# Patient Record
Sex: Male | Born: 1955 | Race: White | Hispanic: No | Marital: Married | Smoking: Never smoker
Health system: Southern US, Community
[De-identification: ages and names within clinical notes are randomized; demographics above are authoritative.]

## PROBLEM LIST (undated history)

## (undated) DIAGNOSIS — I739 Peripheral vascular disease, unspecified: Secondary | ICD-10-CM

## (undated) DIAGNOSIS — U071 COVID-19: Secondary | ICD-10-CM

## (undated) DIAGNOSIS — I471 Supraventricular tachycardia, unspecified: Secondary | ICD-10-CM

## (undated) DIAGNOSIS — I5022 Chronic systolic (congestive) heart failure: Secondary | ICD-10-CM

## (undated) DIAGNOSIS — I429 Cardiomyopathy, unspecified: Secondary | ICD-10-CM

## (undated) HISTORY — PX: TONSILLECTOMY: SUR1361

## (undated) HISTORY — DX: Peripheral vascular disease, unspecified: I73.9

## (undated) HISTORY — DX: Supraventricular tachycardia, unspecified: I47.10

## (undated) HISTORY — PX: WISDOM TOOTH EXTRACTION: SHX21

## (undated) HISTORY — DX: Cardiomyopathy, unspecified: I42.9

## (undated) HISTORY — DX: COVID-19: U07.1

## (undated) HISTORY — DX: Chronic systolic (congestive) heart failure: I50.22

---
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
## 1977-05-31 HISTORY — PX: HERNIA REPAIR: SHX51

## 1998-12-14 ENCOUNTER — Emergency Department (HOSPITAL_COMMUNITY): Admission: EM | Admit: 1998-12-14 | Discharge: 1998-12-14 | Payer: Self-pay | Admitting: Emergency Medicine

## 1998-12-14 ENCOUNTER — Encounter: Payer: Self-pay | Admitting: Emergency Medicine

## 2005-08-11 ENCOUNTER — Ambulatory Visit (HOSPITAL_COMMUNITY): Admission: RE | Admit: 2005-08-11 | Discharge: 2005-08-11 | Payer: Self-pay | Admitting: Family Medicine

## 2005-08-11 ENCOUNTER — Emergency Department (HOSPITAL_COMMUNITY): Admission: EM | Admit: 2005-08-11 | Discharge: 2005-08-11 | Payer: Self-pay | Admitting: Family Medicine

## 2006-05-31 HISTORY — PX: SHOULDER SURGERY: SHX246

## 2006-06-08 ENCOUNTER — Ambulatory Visit: Payer: Self-pay | Admitting: Family Medicine

## 2006-06-08 LAB — CONVERTED CEMR LAB
ALT: 40 units/L (ref 0–40)
CO2: 30 meq/L (ref 19–32)
Calcium: 9.2 mg/dL (ref 8.4–10.5)
Chloride: 112 meq/L (ref 96–112)
Chol/HDL Ratio, serum: 5.1
Cholesterol: 189 mg/dL (ref 0–200)
Creatinine, Ser: 1 mg/dL (ref 0.4–1.5)
Glucose, Bld: 83 mg/dL (ref 70–99)
LDL DIRECT: 102 mg/dL
PSA: 0.9 ng/mL (ref 0.10–4.00)
Potassium: 4.3 meq/L (ref 3.5–5.1)
Sodium: 145 meq/L (ref 135–145)
Triglyceride fasting, serum: 258 mg/dL (ref 0–149)
VLDL: 52 mg/dL — ABNORMAL HIGH (ref 0–40)

## 2006-06-09 ENCOUNTER — Encounter: Payer: Self-pay | Admitting: Family Medicine

## 2006-06-09 LAB — CONVERTED CEMR LAB: Glucose, Bld: 82 mg/dL (ref 70–99)

## 2006-07-18 ENCOUNTER — Ambulatory Visit: Payer: Self-pay | Admitting: Family Medicine

## 2007-02-09 ENCOUNTER — Ambulatory Visit: Payer: Self-pay | Admitting: Family Medicine

## 2008-10-08 ENCOUNTER — Emergency Department (HOSPITAL_COMMUNITY): Admission: EM | Admit: 2008-10-08 | Discharge: 2008-10-09 | Payer: Self-pay | Admitting: Emergency Medicine

## 2008-10-21 ENCOUNTER — Encounter: Admission: RE | Admit: 2008-10-21 | Discharge: 2008-10-21 | Payer: Self-pay | Admitting: Neurological Surgery

## 2008-12-09 ENCOUNTER — Encounter: Admission: RE | Admit: 2008-12-09 | Discharge: 2008-12-09 | Payer: Self-pay | Admitting: Neurological Surgery

## 2009-01-13 ENCOUNTER — Encounter: Admission: RE | Admit: 2009-01-13 | Discharge: 2009-01-13 | Payer: Self-pay | Admitting: Neurological Surgery

## 2009-05-31 HISTORY — PX: COLONOSCOPY: SHX174

## 2009-05-31 HISTORY — PX: POLYPECTOMY: SHX149

## 2009-09-10 ENCOUNTER — Ambulatory Visit: Payer: Self-pay | Admitting: Family Medicine

## 2009-09-10 ENCOUNTER — Encounter (INDEPENDENT_AMBULATORY_CARE_PROVIDER_SITE_OTHER): Payer: Self-pay | Admitting: *Deleted

## 2009-09-10 DIAGNOSIS — R635 Abnormal weight gain: Secondary | ICD-10-CM | POA: Insufficient documentation

## 2009-09-10 DIAGNOSIS — H5310 Unspecified subjective visual disturbances: Secondary | ICD-10-CM | POA: Insufficient documentation

## 2009-09-10 DIAGNOSIS — R0602 Shortness of breath: Secondary | ICD-10-CM | POA: Insufficient documentation

## 2009-09-17 ENCOUNTER — Telehealth: Payer: Self-pay | Admitting: Family Medicine

## 2009-09-18 LAB — CONVERTED CEMR LAB
ALT: 55 units/L — ABNORMAL HIGH (ref 0–53)
AST: 36 units/L (ref 0–37)
Alkaline Phosphatase: 60 units/L (ref 39–117)
Basophils Absolute: 0 10*3/uL (ref 0.0–0.1)
Bilirubin, Direct: 0.1 mg/dL (ref 0.0–0.3)
Chloride: 107 meq/L (ref 96–112)
Cholesterol: 201 mg/dL — ABNORMAL HIGH (ref 0–200)
Eosinophils Relative: 1.1 % (ref 0.0–5.0)
Folate: 11.4 ng/mL
GFR calc non Af Amer: 93.69 mL/min (ref >60)
HCT: 40.3 % (ref 39.0–52.0)
Hemoglobin: 14.3 g/dL (ref 13.0–17.0)
Lymphocytes Relative: 36.6 % (ref 12.0–46.0)
Lymphs Abs: 2.4 10*3/uL (ref 0.7–4.0)
Monocytes Relative: 5.2 % (ref 3.0–12.0)
Neutro Abs: 3.7 10*3/uL (ref 1.4–7.7)
PSA: 0.5 ng/mL (ref 0.10–4.00)
Platelets: 253 10*3/uL (ref 150.0–400.0)
Potassium: 4.6 meq/L (ref 3.5–5.1)
Prolactin: 3.7 ng/mL
RDW: 13.2 % (ref 11.5–14.6)
Sodium: 144 meq/L (ref 135–145)
TSH: 1.44 microintl units/mL (ref 0.35–5.50)
Total Bilirubin: 0.7 mg/dL (ref 0.3–1.2)
Total CHOL/HDL Ratio: 5
VLDL: 28.6 mg/dL (ref 0.0–40.0)
WBC: 6.5 10*3/uL (ref 4.5–10.5)

## 2009-09-22 ENCOUNTER — Encounter: Payer: Self-pay | Admitting: Family Medicine

## 2009-10-20 ENCOUNTER — Ambulatory Visit: Payer: Self-pay | Admitting: Family Medicine

## 2009-10-20 DIAGNOSIS — R5383 Other fatigue: Secondary | ICD-10-CM | POA: Insufficient documentation

## 2009-10-20 DIAGNOSIS — R5381 Other malaise: Secondary | ICD-10-CM | POA: Insufficient documentation

## 2009-10-20 LAB — CONVERTED CEMR LAB
FSH: 6.5 milliintl units/mL (ref 1.4–18.1)
LH: 3.43 milliintl units/mL (ref 1.50–9.30)

## 2009-10-22 LAB — CONVERTED CEMR LAB
Sex Hormone Binding: 27 nmol/L (ref 13–71)
Testosterone Free: 48.6 pg/mL (ref 47.0–244.0)
Testosterone-% Free: 2.1 % (ref 1.6–2.9)

## 2009-10-29 ENCOUNTER — Emergency Department (HOSPITAL_COMMUNITY): Admission: EM | Admit: 2009-10-29 | Discharge: 2009-10-29 | Payer: Self-pay | Admitting: Emergency Medicine

## 2009-10-31 ENCOUNTER — Encounter (INDEPENDENT_AMBULATORY_CARE_PROVIDER_SITE_OTHER): Payer: Self-pay | Admitting: *Deleted

## 2009-11-04 ENCOUNTER — Ambulatory Visit: Payer: Self-pay | Admitting: Gastroenterology

## 2009-12-26 ENCOUNTER — Ambulatory Visit: Payer: Self-pay | Admitting: Gastroenterology

## 2010-01-01 ENCOUNTER — Encounter: Payer: Self-pay | Admitting: Gastroenterology

## 2010-06-30 NOTE — Letter (Signed)
Summary: Patient Notice- Polyp Results  [facility]  [address] [place], [place] [address]   Phone: [phone]  Fax: [phone]        January 01, 2010 MRN: [id number]    Lucas Willis [address] [place], [place]  [address]    Dear [name],  I am pleased to inform you that the colon polyp(s) removed during your recent colonoscopy was (were) found to be benign (no cancer detected) upon pathologic examination.  I recommend you have a repeat colonoscopy examination in 5 years to look for recurrent polyps, as having colon polyps increases your risk for having recurrent polyps or even colon cancer in the future.  Should you develop new or worsening symptoms of abdominal pain, bowel habit changes or bleeding from the rectum or bowels, please schedule an evaluation with either your primary care physician or with me.  Continue treatment plan as outlined the day of your exam.  Please call us if you are having persistent problems or have questions about your condition that have not been fully answered at this time.  Sincerely,  Meryl Dare MD [facility]  This letter has been electronically signed by your physician.  Appended Document: Patient Notice- Polyp Results letter mailed

## 2010-06-30 NOTE — Miscellaneous (Signed)
Summary: [facility]  Clinical Lists Changes  Medications: Added new medication of MOVIPREP 100 GM  SOLR (PEG-KCL-NACL-NASULF-NA ASC-C) As per prep instructions. - Signed Rx of MOVIPREP 100 GM  SOLR (PEG-KCL-NACL-NASULF-NA ASC-C) As per prep instructions.;  #1 x 0;  Signed;  Entered by: Ezra Sites RN;  Authorized by: Meryl Dare MD [facility];  Method used: [facility]  [address], [place], [place]  [address], Ph: [phone] or [phone], Fax: [phone] Observations: Added new observation of ALLERGY REV: Done (11/04/2009 8:01)    Prescriptions: MOVIPREP 100 GM  SOLR (PEG-KCL-NACL-NASULF-NA ASC-C) As per prep instructions.  #1 x 0   Entered by:   Ezra Sites RN   Authorized by:   Meryl Dare MD [facility]   Signed by:   Ezra Sites RN on 11/04/2009   Method used:   [facility]        [facility]  [address] (retail)       [address]       [place], [place]  [address]       Ph: [phone] or [phone]       Fax: [phone]   RxID:   [id number]

## 2010-06-30 NOTE — Procedures (Signed)
Summary: Colonoscopy  Patient: Lucas Willis Note: All result statuses are Final unless otherwise noted.  Tests: (1) Colonoscopy (COL)   COL Colonoscopy           DONE     [facility]     [address]     [place], [place]  [address]           COLONOSCOPY PROCEDURE REPORT           PATIENT:  Doyal, Saric  MR#:  [id number]     BIRTHDATE:  11-Oct-1955, 53 yrs. old  GENDER:  male     ENDOSCOPIST:  Judie Petit T. Russella Dar, MD, [facility]     Referred by:  Excell Seltzer, M.D.     PROCEDURE DATE:  12/26/2009     PROCEDURE:  Colonoscopy with snare polypectomy     ASA CLASS:  Class II     INDICATIONS:  1) Routine Risk Screening     MEDICATIONS:   Fentanyl 50 mcg IV, Versed 6 mg IV     DESCRIPTION OF PROCEDURE:   After the risks benefits and     alternatives of the procedure were thoroughly explained, informed     consent was obtained.  Digital rectal exam was performed and     revealed no abnormalities.   The LB [id number]-[id number] endoscope     was introduced through the anus and advanced to the cecum, which     was identified by both the appendix and ileocecal valve, without     limitations.  The quality of the prep was good, using MoviPrep.     The instrument was then slowly withdrawn as the colon was fully     examined.     <<PROCEDUREIMAGES>>     FINDINGS:  A sessile polyp was found in the proximal transverse     colon. It was 5 mm in size. Polyp was snared without cautery.     Retrieval was successful. Scattered diverticula were found     transverse colon to sigmoid colon.  This was otherwise a normal     examination of the colon. Retroflexed views in the rectum revealed     no abnormalities.  The time to cecum =  2  minutes. The scope was     then withdrawn (time =  8.75  min) from the patient and the     procedure completed.           COMPLICATIONS:  None           ENDOSCOPIC IMPRESSION:     1) 5 mm sessile polyp in the proximal transverse colon     2) Diverticula,  scattered           RECOMMENDATIONS:     1) Await pathology results     2) High fiber diet with liberal fluid intake.     3) If the polyp removed today is adenomatous (pre-cancerous),     you will need a repeat colonoscopy in 5 years. Otherwise you     should continue to follow colorectal cancer screening guidelines     for "routine risk" patients with colonoscopy in 10 years.     Venita Lick. Russella Dar, MD, [facility]           n.     eSIGNED:   Venita Lick. Quentin Strebel at 12/26/2009 01:56 PM           Bynum Bellows, [id number]  Note: An exclamation mark (!) indicates a result  that was not dispersed into the flowsheet. Document Creation Date: 12/26/2009 1:56 PM _______________________________________________________________________  (1) Order result status: Final Collection or observation date-time: 12/26/2009 13:51 Requested date-time:  Receipt date-time:  Reported date-time:  Referring Physician:   Ordering Physician: Claudette Head [phone]) Specimen Source:  Source: Launa Grill Order Number: [id number] Lab site:   Appended Document: Colonoscopy     Procedures Next Due Date:    Colonoscopy: 11/2014

## 2010-06-30 NOTE — Progress Notes (Signed)
Summary: pt requesting lab results  Phone Note Call from Patient Call back at [phone]   Caller: Patient Call For: Kerby Nora MD Summary of Call: Pt is requesting lab results.  Please review and advise. Initial call taken by: Lowella Petties CMA,  September 17, 2009 3:30 PM

## 2010-06-30 NOTE — Letter (Signed)
Summary: Previsit letter  [facility]  [address] [place], [place] [address]   Phone: [phone]  Fax: [phone]       09/10/2009 MRN: [id number]  Lucas Willis [address] [place], [place]  [address]  Dear Mr. [facility],  Welcome to the Gastroenterology Division at [facility].    You are scheduled to see a nurse for your pre-procedure visit on October 31, 2009 at 8:00am on the 3rd floor at [facility], [address]  We ask that you try to arrive at our office 15 minutes prior to your appointment time to allow for check-in.  Your nurse visit will consist of discussing your medical and surgical history, your immediate family medical history, and your medications.    Please bring a complete list of all your medications or, if you prefer, bring the medication bottles and we will list them.  We will need to be aware of both prescribed and over the counter drugs.  We will need to know exact dosage information as well.  If you are on blood thinners (Coumadin, Plavix, Aggrenox, Ticlid, etc.) please call our office today/prior to your appointment, as we need to consult with your physician about holding your medication.   Please be prepared to read and sign documents such as consent forms, a financial agreement, and acknowledgement forms.  If necessary, and with your consent, a friend or relative is welcome to sit-in on the nurse visit with you.  Please bring your insurance card so that we may make a copy of it.  If your insurance requires a referral to see a specialist, please bring your referral form from your primary care physician.  No co-pay is required for this nurse visit.     If you cannot keep your appointment, please call [phone] to cancel or reschedule prior to your appointment date.  This allows [place] the opportunity to schedule an appointment for another patient in need of care.    Thank you for choosing  Gastroenterology for your medical  needs.  We appreciate the opportunity to care for you.  Please visit [place] at our website  to learn more about our practice.                     Sincerely.                                                                                                                   The Gastroenterology Division
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
## 2010-06-30 NOTE — Letter (Signed)
Summary: [facility] Instructions  [facility]  [address] [place], [place] [address]   Phone: [phone]  Fax: [phone]       Lucas Willis    04/30/1969    MRN: [id number]        Procedure Day /Date: Friday 12/26/09     Arrival Time: 12:30 pm     Procedure Time: 1:30 pm     Location of Procedure:                    _x_   Endoscopy Center (4th Floor)   PREPARATION FOR COLONOSCOPY WITH MOVIPREP   Starting 5 days prior to your procedure  12/21/09  do not eat nuts, seeds, popcorn, corn, beans, peas,  salads, or any raw vegetables.  Do not take any fiber supplements (e.g. Metamucil, Citrucel, and Benefiber).  THE DAY BEFORE YOUR PROCEDURE         DATE:  12/25/09  DAY:  Thursday  1.  Drink clear liquids the entire day-NO SOLID FOOD  2.  Do not drink anything colored red or purple.  Avoid juices with pulp.  No orange juice.  3.  Drink at least 64 oz. (8 glasses) of fluid/clear liquids during the day to prevent dehydration and help the prep work efficiently.  CLEAR LIQUIDS INCLUDE: Water Jello Ice Popsicles Tea (sugar ok, no milk/cream) Powdered fruit flavored drinks Coffee (sugar ok, no milk/cream) Gatorade Juice: apple, white grape, white cranberry  Lemonade Clear bullion, consomm, broth Carbonated beverages (any kind) Strained chicken noodle soup Hard Candy                             4.  In the morning, mix first dose of MoviPrep solution:    Empty 1 Pouch A and 1 Pouch B into the disposable container    Add lukewarm drinking water to the top line of the container. Mix to dissolve    Refrigerate (mixed solution should be used within 24 hrs)  5.  Begin drinking the prep at 5:00 p.m. The MoviPrep container is divided by 4 marks.   Every 15 minutes drink the solution down to the next mark (approximately 8 oz) until the full liter is complete.   6.  Follow completed prep with 16 oz of clear liquid of your choice (Nothing red or purple).   Continue to drink clear liquids until bedtime.  7.  Before going to bed, mix second dose of MoviPrep solution:    Empty 1 Pouch A and 1 Pouch B into the disposable container    Add lukewarm drinking water to the top line of the container. Mix to dissolve    Refrigerate  THE DAY OF YOUR PROCEDURE      DATE:  12/26/09  DAY:  Friday  Beginning at  8:30 a.m. (5 hours before procedure):         1. Every 15 minutes, drink the solution down to the next mark (approx 8 oz) until the full liter is complete.  2. Follow completed prep with 16 oz. of clear liquid of your choice.    3. You may drink clear liquids until  11:30 a.m.  (2 HOURS BEFORE PROCEDURE).   MEDICATION INSTRUCTIONS  Unless otherwise instructed, you should take regular prescription medications with a small sip of water   as early as possible the morning of your procedure.        OTHER INSTRUCTIONS  You will need a responsible adult at least 55 years of age to accompany you and drive you home.   This person must remain in the waiting room during your procedure.  Wear loose fitting clothing that is easily removed.  Leave jewelry and other valuables at home.  However, you may wish to bring a book to read or  an iPod/MP3 player to listen to music as you wait for your procedure to start.  Remove all body piercing jewelry and leave at home.  Total time from sign-in until discharge is approximately 2-3 hours.  You should go home directly after your procedure and rest.  You can resume normal activities the  day after your procedure.  The day of your procedure you should not:   Drive   Make legal decisions   Operate machinery   Drink alcohol   Return to work  You will receive specific instructions about eating, activities and medications before you leave.    The above instructions have been reviewed and explained to me by   Ezra Sites RN  November 04, 2009 8:24 AM     I fully understand and can verbalize  these instructions _____________________________ Date _________
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
## 2013-08-20 ENCOUNTER — Emergency Department (HOSPITAL_COMMUNITY)
Admission: EM | Admit: 2013-08-20 | Discharge: 2013-08-20 | Disposition: A | Discharge status: Home / Self Care | Payer: 59 | Source: Home / Self Care | Attending: Emergency Medicine | Admitting: Emergency Medicine

## 2013-08-20 ENCOUNTER — Encounter (HOSPITAL_COMMUNITY): Payer: Self-pay | Admitting: Emergency Medicine

## 2013-08-20 ENCOUNTER — Emergency Department (INDEPENDENT_AMBULATORY_CARE_PROVIDER_SITE_OTHER): Payer: 59

## 2013-08-20 DIAGNOSIS — W19XXXA Unspecified fall, initial encounter: Secondary | ICD-10-CM

## 2013-08-20 DIAGNOSIS — S7000XA Contusion of unspecified hip, initial encounter: Secondary | ICD-10-CM

## 2013-08-20 DIAGNOSIS — S7001XA Contusion of right hip, initial encounter: Secondary | ICD-10-CM

## 2013-08-20 LAB — POCT URINALYSIS DIP (DEVICE)
BILIRUBIN URINE: NEGATIVE
GLUCOSE, UA: NEGATIVE mg/dL
HGB URINE DIPSTICK: NEGATIVE
Leukocytes, UA: NEGATIVE
Nitrite: NEGATIVE
Protein, ur: NEGATIVE mg/dL
SPECIFIC GRAVITY, URINE: 1.025 (ref 1.005–1.030)
Urobilinogen, UA: 0.2 mg/dL (ref 0.0–1.0)
pH: 5.5 (ref 5.0–8.0)

## 2013-08-20 MED ORDER — DICLOFENAC SODIUM 75 MG PO TBEC: 75.0000 mg | DELAYED_RELEASE_TABLET | Freq: Two times a day (BID) | ORAL | Status: DC

## 2013-08-20 MED ORDER — OXYCODONE-ACETAMINOPHEN 5-325 MG PO TABS: ORAL_TABLET | ORAL | Status: DC
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
## 2013-08-20 NOTE — Discharge Instructions (Signed)
RICE: Routine Care for Injuries The routine care of many injuries includes Rest, Ice, Compression, and Elevation (RICE). HOME CARE INSTRUCTIONS  Rest is needed to allow your body to heal. Routine activities can usually be resumed when comfortable. Injured tendons and bones can take up to 6 weeks to heal. Tendons are the cord-like structures that attach muscle to bone.  Ice following an injury helps keep the swelling down and reduces pain.  Put ice in a plastic bag.  Place a towel between your skin and the bag.  Leave the ice on for 15-20 minutes, 03-04 times a day. Do this while awake, for the first 24 to 48 hours. After that, continue as directed by your caregiver.  Compression helps keep swelling down. It also gives support and helps with discomfort. If an elastic bandage has been applied, it should be removed and reapplied every 3 to 4 hours. It should not be applied tightly, but firmly enough to keep swelling down. Watch fingers or toes for swelling, bluish discoloration, coldness, numbness, or excessive pain. If any of these problems occur, remove the bandage and reapply loosely. Contact your caregiver if these problems continue.  Elevation helps reduce swelling and decreases pain. With extremities, such as the arms, hands, legs, and feet, the injured area should be placed near or above the level of the heart, if possible. SEEK IMMEDIATE MEDICAL CARE IF:  You have persistent pain and swelling.  You develop redness, numbness, or unexpected weakness.  Your symptoms are getting worse rather than improving after several days. These symptoms may indicate that further evaluation or further X-rays are needed. Sometimes, X-rays may not show a small broken bone (fracture) until 1 week or 10 days later. Make a follow-up appointment with your caregiver. Ask when your X-ray results will be ready. Make sure you get your X-ray results. Document Released: 08/29/2000 Document Revised: 08/09/2011  Document Reviewed: 10/16/2010 [facility] Patient Information 2014 [place], [place].   Most hip pain is caused by osteoarthritis, bursitis, or tendonitis.  Simple measures plus regular gentle exercises can help.  Do not do the following:  Avoid squatting and doing deep knee bends.  This puts too much of load on your cartiledges and tendons.  If you do a knee bend, go only half way down, flexing your knee no more than 90 degrees.  Avoid sleeping on the side that hurts.  Do the following:  If you are overweight or obese, lose weight.  This makes for a lot less load on your hip joints.  If you use tobacco, quit.  Nicotine causes spasm of the small arteries, decreases blood flow, and impairs your body's normal ability to repair damage.  If your hip is acutely inflamed, use the principles of RICE (rest, ice, compression, and elevation).  Use of over the counter pain meds can be of help.  Tylenol (or acetaminophen) is the safest to use.  It often helps to take this regularly.  You can take up to 2 325 mg tablets 5 times daily, but it best to start out much lower that that, perhaps 2 325 mg tablets twice daily, then increase from there. People who are on the blood thinner warfarin have to be careful about taking high doses of Tylenol.  For people who are able to tolerate them, ibuprofen and naproxyn can also help with the pain.  You should discuss these agents with your physician before taking them.  People with chronic kidney disease, hypertension, peptic ulcer disease, and reflux can suffer  adverse side effects. They should not be taken with warfarin. The maximum dosage of ibuprofen is 800 mg 3 times daily with meals.  The maximum dosage of naprosyn is 2 and 1/2 tablets twice daily with food, but again, start out low and gradually increase the dose until adequate pain relief is achieved. Ibuprofen and naprosyn should always be taken with food.  People with cartiledge injury or osteoarthritis may find  glucosamine to be helpful.  This is an over-the-counter supplement that helps nourish and repair cartiledge.  The dose is 500 mg 3 times daily or 1500 mg taken in a single dose. This can take several months to work and it doesn't always work.    For people with hip pain on just one side, use of a cane held in the hand on the same side as the hip pain takes some of the stress off the hip joint and can make a big difference in hip pain.  Wearing good shoes with adequate arch support is essential. Use of an orthotic insert can be very helpful.  These can be purchased at a shoe store or inexpensive inserts can be gotten at the drug store.  Regular exercise is of utmost importance.  Swimming, water aerobics, low impact aerobics, yoga or tai chi are helpful.  Use of an elliptical exerciser put the least stress on the hips of any type of exercise machine.  Finally doing the exercises below can be very helpful. Try to do them twice a day followed by ice for 10 minutes.

## 2013-09-04 ENCOUNTER — Telehealth (HOSPITAL_COMMUNITY): Payer: Self-pay | Admitting: *Deleted

## 2013-09-04 NOTE — ED Notes (Signed)
Pt. called and told registration clerk that he needed a refill of his anti-inflammatory drug.  I called pt. back and left a message that we are not a primary care facility and we do not do refills of medications.  I told him he would have to come back and be rechecked and to call me back if any questions. Roselyn Meier 09/04/2013

## 2014-05-14 ENCOUNTER — Ambulatory Visit (INDEPENDENT_AMBULATORY_CARE_PROVIDER_SITE_OTHER): Payer: 59 | Admitting: Family Medicine

## 2014-05-14 ENCOUNTER — Encounter (INDEPENDENT_AMBULATORY_CARE_PROVIDER_SITE_OTHER): Payer: Self-pay

## 2014-05-14 ENCOUNTER — Encounter: Payer: Self-pay | Admitting: Family Medicine

## 2014-05-14 VITALS — BP 115/82 | HR 89 | Temp 98.4°F | Ht 68.5 in | Wt 202.5 lb

## 2014-05-14 DIAGNOSIS — Z125 Encounter for screening for malignant neoplasm of prostate: Secondary | ICD-10-CM

## 2014-05-14 DIAGNOSIS — Z13220 Encounter for screening for lipoid disorders: Secondary | ICD-10-CM

## 2014-05-14 DIAGNOSIS — R61 Generalized hyperhidrosis: Secondary | ICD-10-CM

## 2014-05-14 DIAGNOSIS — R252 Cramp and spasm: Secondary | ICD-10-CM

## 2014-05-14 DIAGNOSIS — M25562 Pain in left knee: Secondary | ICD-10-CM

## 2014-05-14 HISTORY — DX: Cramp and spasm: R25.2

## 2014-05-14 LAB — LIPID PANEL
Cholesterol: 223 mg/dL — ABNORMAL HIGH (ref 0–200)
HDL: 37.9 mg/dL — AB (ref >39.00)
LDL CALC: 158 mg/dL — AB (ref 0–99)
NONHDL: 185.1
Total CHOL/HDL Ratio: 6
Triglycerides: 134 mg/dL (ref 0.0–149.0)
VLDL: 26.8 mg/dL (ref 0.0–40.0)

## 2014-05-14 LAB — COMPREHENSIVE METABOLIC PANEL
ALK PHOS: 66 U/L (ref 39–117)
ALT: 31 U/L (ref 0–53)
AST: 29 U/L (ref 0–37)
Albumin: 3.9 g/dL (ref 3.5–5.2)
BILIRUBIN TOTAL: 0.8 mg/dL (ref 0.2–1.2)
BUN: 20 mg/dL (ref 6–23)
CO2: 25 mEq/L (ref 19–32)
CREATININE: 0.9 mg/dL (ref 0.4–1.5)
Calcium: 9.4 mg/dL (ref 8.4–10.5)
Chloride: 109 mEq/L (ref 96–112)
GFR: 97.07 mL/min (ref >60.00)
GLUCOSE: 91 mg/dL (ref 70–99)
Potassium: 4.7 mEq/L (ref 3.5–5.1)
SODIUM: 138 meq/L (ref 135–145)
TOTAL PROTEIN: 6.7 g/dL (ref 6.0–8.3)

## 2014-05-14 LAB — CBC WITH DIFFERENTIAL/PLATELET
BASOS PCT: 0.6 % (ref 0.0–3.0)
Basophils Absolute: 0 10*3/uL (ref 0.0–0.1)
EOS ABS: 0.1 10*3/uL (ref 0.0–0.7)
EOS PCT: 1.7 % (ref 0.0–5.0)
HEMATOCRIT: 45 % (ref 39.0–52.0)
HEMOGLOBIN: 15.1 g/dL (ref 13.0–17.0)
Lymphocytes Relative: 34.1 % (ref 12.0–46.0)
Lymphs Abs: 2.4 10*3/uL (ref 0.7–4.0)
MCHC: 33.7 g/dL (ref 30.0–36.0)
MCV: 96.4 fl (ref 78.0–100.0)
MONO ABS: 0.4 10*3/uL (ref 0.1–1.0)
Monocytes Relative: 5.7 % (ref 3.0–12.0)
NEUTROS ABS: 4.1 10*3/uL (ref 1.4–7.7)
Neutrophils Relative %: 57.9 % (ref 43.0–77.0)
Platelets: 275 10*3/uL (ref 150.0–400.0)
RBC: 4.67 Mil/uL (ref 4.22–5.81)
RDW: 13.4 % (ref 11.5–15.5)
WBC: 7.1 10*3/uL (ref 4.0–10.5)

## 2014-05-14 LAB — TSH: TSH: 1.74 u[IU]/mL (ref 0.35–4.50)

## 2014-05-14 LAB — PSA: PSA: 0.83 ng/mL (ref 0.10–4.00)

## 2014-05-14 NOTE — Patient Instructions (Addendum)
Stop at lab on way out. Cancel lab only visit, keep appt for CPX as scheduled. Increase water daily, goal 64 oz of water a day.
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
## 2014-05-14 NOTE — Progress Notes (Signed)
Pre visit review using our clinic review tool, if applicable. No additional management support is needed unless otherwise documented below in the visit note. 

## 2014-05-14 NOTE — Assessment & Plan Note (Signed)
Increase hydration. Eval with labs. Gentle stretching.

## 2014-05-14 NOTE — Assessment & Plan Note (Signed)
Per Dr. Theda Sers.

## 2014-05-14 NOTE — Assessment & Plan Note (Signed)
Will eval with labs. No there red flags.

## 2014-05-15 ENCOUNTER — Encounter: Payer: Self-pay | Admitting: *Deleted

## 2014-05-21 ENCOUNTER — Other Ambulatory Visit: Payer: 59

## 2014-06-04 ENCOUNTER — Ambulatory Visit (INDEPENDENT_AMBULATORY_CARE_PROVIDER_SITE_OTHER): Payer: BLUE CROSS/BLUE SHIELD | Admitting: Family Medicine

## 2014-06-04 ENCOUNTER — Encounter: Payer: Self-pay | Admitting: Family Medicine

## 2014-06-04 VITALS — BP 102/80 | HR 87 | Temp 98.4°F | Ht 68.5 in | Wt 202.5 lb

## 2014-06-04 DIAGNOSIS — E7800 Pure hypercholesterolemia, unspecified: Secondary | ICD-10-CM

## 2014-06-04 HISTORY — DX: Pure hypercholesterolemia, unspecified: E78.00

## 2014-06-04 NOTE — Progress Notes (Signed)
Pre visit review using our clinic review tool, if applicable. No additional management support is needed unless otherwise documented below in the visit note. 

## 2014-06-04 NOTE — Progress Notes (Signed)
Subjective:    Patient ID: Lucas Willis, male    DOB: 02/05/1956, 59 y.o.   MRN: [id number]  HPI 59 year old male presents for wellness visit.   Hypercholesterol: Not at goal LDL < 130 on no medication. Lab Results  Component Value Date   CHOL 223* 05/14/2014   HDL 37.90* 05/14/2014   LDLCALC 158* 05/14/2014   LDLDIRECT 142.0 09/10/2009   TRIG 134.0 05/14/2014   CHOLHDL 6 05/14/2014  Diet: poor, eats fast food daily Exercise:off and on. Plays soccer.  Wt Readings from Last 3 Encounters:  06/04/14 202 lb 8 oz (91.853 kg)  05/14/14 202 lb 8 oz (91.853 kg)  09/10/09 208 lb (94.348 kg)     Review of Systems  Constitutional: Negative for fever, fatigue and unexpected weight change.  HENT: Negative for congestion, ear pain, postnasal drip, rhinorrhea, sore throat and trouble swallowing.   Eyes: Negative for pain.  Respiratory: Negative for cough, shortness of breath and wheezing.   Cardiovascular: Negative for chest pain, palpitations and leg swelling.  Gastrointestinal: Negative for nausea, abdominal pain, diarrhea, constipation and blood in stool.  Genitourinary: Negative for dysuria, urgency, hematuria, discharge, penile swelling, scrotal swelling, difficulty urinating, penile pain and testicular pain.  Musculoskeletal:       STILL OCC ANKLE LOCKS IN PLACE, NML LABS, OCC SWELLING IN LEFT ANKLE  Skin: Negative for rash.  Neurological: Negative for syncope, weakness, light-headedness, numbness and headaches.  Psychiatric/Behavioral: Negative for behavioral problems and dysphoric mood. The patient is not nervous/anxious.        Objective:   Physical Exam  Constitutional: He appears well-developed and well-nourished.  Non-toxic appearance. He does not appear ill. No distress.  HENT:  Head: Normocephalic and atraumatic.  Right Ear: Hearing, tympanic membrane, external ear and ear canal normal.  Left Ear: Hearing, tympanic membrane, external ear and ear canal normal.    Nose: Nose normal.  Mouth/Throat: Uvula is midline, oropharynx is clear and moist and mucous membranes are normal.  Eyes: Conjunctivae, EOM and lids are normal. Pupils are equal, round, and reactive to light. Lids are everted and swept, no foreign bodies found.  Neck: Trachea normal, normal range of motion and phonation normal. Neck supple. Carotid bruit is not present. No thyroid mass and no thyromegaly present.  Cardiovascular: Normal rate, regular rhythm, S1 normal, S2 normal, intact distal pulses and normal pulses.  Exam reveals no gallop.   No murmur heard. Pulmonary/Chest: Breath sounds normal. He has no wheezes. He has no rhonchi. He has no rales.  Abdominal: Soft. Normal appearance and bowel sounds are normal. There is no hepatosplenomegaly. There is no tenderness. There is no rebound, no guarding and no CVA tenderness. No hernia. Hernia confirmed negative in the right inguinal area and confirmed negative in the left inguinal area.  Genitourinary: Prostate normal, testes normal and penis normal. Rectal exam shows no external hemorrhoid, no internal hemorrhoid, no fissure, no mass, no tenderness and anal tone normal. Guaiac negative stool. Prostate is not enlarged and not tender. Right testis shows no mass and no tenderness. Left testis shows no mass and no tenderness. No paraphimosis or penile tenderness.  Lymphadenopathy:    He has no cervical adenopathy.       Right: No inguinal adenopathy present.       Left: No inguinal adenopathy present.  Neurological: He is alert. He has normal strength and normal reflexes. No cranial nerve deficit or sensory deficit. Gait normal.  Skin: Skin is warm, dry and  intact. No rash noted.  Psychiatric: He has a normal mood and affect. His speech is normal and behavior is normal. Judgment normal.          Assessment & Plan:  The patient's preventative maintenance and recommended screening tests for an annual wellness exam were reviewed in full  today. Brought up to date unless services declined.  Counselled on the importance of diet, exercise, and its role in overall health and mortality. The patient's FH and SH was reviewed, including their home life, tobacco status, and drug and alcohol status.   Vaccines:uptodate with Td, flu Colon: last 11/2009, 5 mm sessile polyp,  Repeat in 5 years per Dr.  Fuller Plan  STD: no concerns.  Nonsmoker  Prostate:  Lab Results  Component Value Date   PSA 0.83 05/14/2014   PSA 0.50 09/10/2009   PSA 0.90 06/08/2006

## 2014-06-04 NOTE — Patient Instructions (Addendum)
Work on low cholesterol diet and start regular exercise . Work on weight loss.  Do not skip meals. Decrease fast foods.  Schedule lab recheck for cholesterol in 3 months. Expect colonoscopy repeat after 11/2014.

## 2014-08-01 ENCOUNTER — Telehealth: Payer: Self-pay

## 2014-08-01 NOTE — Telephone Encounter (Signed)
Pt request itemized bill for 06/04/2014 office visit; pt has supplemental ins that he wants to file. Advised to call [facility] [phone]. Pt voiced understanding.

## 2014-10-17 ENCOUNTER — Encounter: Payer: Self-pay | Admitting: Gastroenterology

## 2014-11-05 ENCOUNTER — Encounter: Payer: Self-pay | Admitting: Gastroenterology

## 2015-01-19 ENCOUNTER — Encounter (HOSPITAL_COMMUNITY): Payer: Self-pay | Admitting: Emergency Medicine

## 2015-01-19 ENCOUNTER — Emergency Department (INDEPENDENT_AMBULATORY_CARE_PROVIDER_SITE_OTHER)
Admission: EM | Admit: 2015-01-19 | Discharge: 2015-01-19 | Disposition: A | Discharge status: Home / Self Care | Payer: BLUE CROSS/BLUE SHIELD | Source: Home / Self Care | Attending: Emergency Medicine | Admitting: Emergency Medicine

## 2015-01-19 DIAGNOSIS — S41112A Laceration without foreign body of left upper arm, initial encounter: Secondary | ICD-10-CM | POA: Diagnosis not present

## 2015-01-19 NOTE — Discharge Instructions (Signed)
Laceration Care, Adult A laceration is a cut or lesion that goes through all layers of the skin and into the tissue just beneath the skin. TREATMENT  Some lacerations may not require closure. Some lacerations may not be able to be closed due to an increased risk of infection. It is important to see your caregiver as soon as possible after an injury to minimize the risk of infection and maximize the opportunity for successful closure. If closure is appropriate, pain medicines may be given, if needed. The wound will be cleaned to help prevent infection. Your caregiver will use stitches (sutures), staples, wound glue (adhesive), or skin adhesive strips to repair the laceration. These tools bring the skin edges together to allow for faster healing and a better cosmetic outcome. However, all wounds will heal with a scar. Once the wound has healed, scarring can be minimized by covering the wound with sunscreen during the day for 1 full year. HOME CARE INSTRUCTIONS  For sutures or staples:  Keep the wound clean and dry.  If you were given a bandage (dressing), you should change it at least once a day. Also, change the dressing if it becomes wet or dirty, or as directed by your caregiver.  Wash the wound with soap and water 2 times a day. Rinse the wound off with water to remove all soap. Pat the wound dry with a clean towel.  After cleaning, apply a thin layer of the antibiotic ointment as recommended by your caregiver. This will help prevent infection and keep the dressing from sticking.  You may shower as usual after the first 24 hours. Do not soak the wound in water until the sutures are removed.  Only take over-the-counter or prescription medicines for pain, discomfort, or fever as directed by your caregiver.  Get your sutures or staples removed as directed by your caregiver. You may need a tetanus shot if:  You cannot remember when you had your last tetanus shot.  You have never had a tetanus  shot. If you get a tetanus shot, your arm may swell, get red, and feel warm to the touch. This is common and not a problem. If you need a tetanus shot and you choose not to have one, there is a rare chance of getting tetanus. Sickness from tetanus can be serious. SEEK MEDICAL CARE IF:   You have redness, swelling, or increasing pain in the wound.  You see a red line that goes away from the wound.  You have yellowish-white fluid (pus) coming from the wound.  You have a fever.  You notice a bad smell coming from the wound or dressing.  Your wound breaks open before or after sutures have been removed.  You notice something coming out of the wound such as wood or glass.  Your wound is on your hand or foot and you cannot move a finger or toe. SEEK IMMEDIATE MEDICAL CARE IF:   Your pain is not controlled with prescribed medicine.  You have severe swelling around the wound causing pain and numbness or a change in color in your arm, hand, leg, or foot.  Your wound splits open and starts bleeding.  You have worsening numbness, weakness, or loss of function of any joint around or beyond the wound.  You develop painful lumps near the wound or on the skin anywhere on your body. MAKE SURE YOU:   Understand these instructions.  Will watch your condition.  Will get help right away if you are not  doing well or get worse. Document Released: 05/17/2005 Document Revised: 08/09/2011 Document Reviewed: 11/10/2010 [facility] Patient Information 2015 [place], [place]. This information is not intended to replace advice given to you by your health care provider. Make sure you discuss any questions you have with your health care provider.
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
## 2015-05-23 ENCOUNTER — Ambulatory Visit (INDEPENDENT_AMBULATORY_CARE_PROVIDER_SITE_OTHER): Payer: BLUE CROSS/BLUE SHIELD | Admitting: Family Medicine

## 2015-05-23 ENCOUNTER — Encounter: Payer: Self-pay | Admitting: Family Medicine

## 2015-05-23 VITALS — BP 138/98 | HR 101 | Temp 97.7°F | Wt 206.0 lb

## 2015-05-23 DIAGNOSIS — R252 Cramp and spasm: Secondary | ICD-10-CM | POA: Diagnosis not present

## 2015-05-23 DIAGNOSIS — L309 Dermatitis, unspecified: Secondary | ICD-10-CM | POA: Diagnosis not present

## 2015-05-23 LAB — COMPREHENSIVE METABOLIC PANEL
ALK PHOS: 65 U/L (ref 39–117)
ALT: 30 U/L (ref 0–53)
AST: 36 U/L (ref 0–37)
Albumin: 3.9 g/dL (ref 3.5–5.2)
BILIRUBIN TOTAL: 0.4 mg/dL (ref 0.2–1.2)
BUN: 35 mg/dL — AB (ref 6–23)
CO2: 28 mEq/L (ref 19–32)
CREATININE: 1.01 mg/dL (ref 0.40–1.50)
Calcium: 9.2 mg/dL (ref 8.4–10.5)
Chloride: 108 mEq/L (ref 96–112)
GFR: 80.35 mL/min (ref >60.00)
GLUCOSE: 96 mg/dL (ref 70–99)
Potassium: 4.3 mEq/L (ref 3.5–5.1)
SODIUM: 142 meq/L (ref 135–145)
TOTAL PROTEIN: 6.4 g/dL (ref 6.0–8.3)

## 2015-05-23 LAB — MAGNESIUM: Magnesium: 1.6 mg/dL (ref 1.5–2.5)

## 2015-05-23 MED ORDER — TRIAMCINOLONE ACETONIDE 0.5 % EX CREA: 1.0000 "application " | TOPICAL_CREAM | Freq: Two times a day (BID) | CUTANEOUS | Status: DC

## 2015-05-23 NOTE — Progress Notes (Signed)
Pre visit review using our clinic review tool, if applicable. No additional management support is needed unless otherwise documented below in the visit note. 

## 2015-05-23 NOTE — Progress Notes (Signed)
   Subjective:    Patient ID: Lucas Willis, male    DOB: Nov 11, 1955, 59 y.o.   MRN: [id number]  HPI  59 year old male presents with new onset leg cramps bilateral in last several months, intermittent, worsening. Ongoing every other night. Bilateral, usually left sided cramps in ankles, calves,  One time very intense. Lasted 10 min one time. occ thigh cramps.  Mainly on left side.  occ cramp in fingers as well. Only happens in evening sitting in bed or in chair.  He denies restless feeling in legs. Muscles occ twitch. Numbness, no weakness. No fever.    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 TSH nml, cbc nml,  cmet nml 04/2014. No longer having night sweats.  Occ takes wifes potassium , maybe helps some in few days.  Takes ibuprofen and  Daily vit , as well as vit B12.  For years he has had itchy area on left dorsal hand. No rash.. Now with scratching thickned skin and broken hairs.  No improvement with hydrocortisone cream.   He is on no new meds. Social History /Family History/Past Medical History reviewed and updated if needed.   Review of Systems  Constitutional: Negative for fever and fatigue.  HENT: Negative for ear pain.   Eyes: Negative for pain.  Respiratory: Negative for cough.   Cardiovascular: Negative for chest pain and leg swelling.       Objective:   Physical Exam  Constitutional: Vital signs are normal. He appears well-developed and well-nourished.  HENT:  Head: Normocephalic.  Right Ear: Hearing normal.  Left Ear: Hearing normal.  Nose: Nose normal.  Mouth/Throat: Oropharynx is clear and moist and mucous membranes are normal.  Neck: Trachea normal. Carotid bruit is not present. No thyroid mass and no thyromegaly present.  Cardiovascular: Normal rate, regular rhythm and normal pulses.  Exam reveals no gallop, no distant heart sounds and no friction rub.   No murmur heard. No peripheral edema    Pulmonary/Chest: Effort normal and breath sounds normal. No respiratory distress.  Skin: Skin is warm, dry and intact. Rash noted. No erythema.  Dry thickened skin on left dorsal hand, broken hairs from rubbing area  Psychiatric: He has a normal mood and affect. His speech is normal and behavior is normal. Thought content normal.          Assessment & Plan:

## 2015-05-23 NOTE — Patient Instructions (Addendum)
Try yellow mustard or tonic water nightly.  Start nightly stretching. Stop at lab on way out. Apply steroid cream ot thickened skin on left hand twice daily x 2 weeks, can use for reoccurance as well.

## 2015-05-23 NOTE — Assessment & Plan Note (Signed)
Eval with labs. May be from lactic acid. No clear sign of OVD.  Treat with gentle stretchin and yellow mustard. If not improving consider requip for possible atypical RLS.

## 2015-05-23 NOTE — Assessment & Plan Note (Signed)
Likely from chronic scratching. Treat with topical steroid cream BID x 2 weeks.

## 2015-08-11 ENCOUNTER — Encounter (HOSPITAL_COMMUNITY): Payer: Self-pay | Admitting: Emergency Medicine

## 2015-08-11 ENCOUNTER — Emergency Department (INDEPENDENT_AMBULATORY_CARE_PROVIDER_SITE_OTHER): Payer: BLUE CROSS/BLUE SHIELD

## 2015-08-11 ENCOUNTER — Emergency Department (INDEPENDENT_AMBULATORY_CARE_PROVIDER_SITE_OTHER)
Admission: EM | Admit: 2015-08-11 | Discharge: 2015-08-11 | Disposition: A | Discharge status: Home / Self Care | Payer: BLUE CROSS/BLUE SHIELD | Source: Home / Self Care | Attending: Emergency Medicine | Admitting: Emergency Medicine

## 2015-08-11 DIAGNOSIS — R0609 Other forms of dyspnea: Secondary | ICD-10-CM

## 2015-08-11 DIAGNOSIS — R9431 Abnormal electrocardiogram [ECG] [EKG]: Secondary | ICD-10-CM

## 2015-08-11 DIAGNOSIS — R0600 Dyspnea, unspecified: Secondary | ICD-10-CM

## 2015-08-11 NOTE — Discharge Instructions (Signed)
Your EKG has some abnormalities that need to be evaluated further. Since you look so well, I spoke with Dr. Terrence Dupont, a cardiologist. Please call his office first thing tomorrow to set up an appointment for additional evaluation. If you develop chest pain, nausea, dizziness, sweatiness, please go directly to the emergency room.

## 2015-08-11 NOTE — ED Notes (Signed)
The patient presented to the [facility] with a complaint of shortness of breath that has been ongoing greater than a month.

## 2015-08-11 NOTE — ED Provider Notes (Signed)
CSN: [id number]     Arrival date & time 08/11/15  1820 History   First MD Initiated Contact with Patient 08/11/15 1950     Chief Complaint  Patient presents with  . Shortness of Breath   (Consider location/radiation/quality/duration/timing/severity/associated sxs/prior Treatment) HPI  He is a 60 year old man here for evaluation of shortness of breath. He states this started about a month ago. He states it only occurs with exertion. He will fill a slight discomfort in the xiphoid area when this occurs. This resolves quickly with rest. No palpitations, diaphoresis, or dizziness. He also reports worsening swelling in his legs over the last month.  He states he can run for about a minute before having to stop to catch his breath.  He states his wife told him his color is off.  He also reports getting a head rush very quickly when he bends forward.  He has no medical history. His mother does have a history of heart failure, but no MI. He has previously been a very active man, regularly running, playing soccer, and refereeing soccer. He was diagnosed and treated for bronchial pneumonia in January, then had a ruptured tendon in his left leg.  He noticed his symptoms when he tried to go for run when the boot was removed.  History reviewed. No pertinent past medical history. Past Surgical History  Procedure Laterality Date  . Cyst removal leg Left 1970    back of L knee  . Plantar's wart excision  1972  . Hernia repair Left 1979    inguinal  . Shoulder surgery Right 2008    Dislocation Repair   Family History  Problem Relation Age of Onset  . Heart failure Mother    Social History  Substance Use Topics  . Smoking status: Never Smoker   . Smokeless tobacco: Never Used  . Alcohol Use: 0.0 oz/week    0 Standard drinks or equivalent per week     Comment: rare    Review of Systems As in history of present illness Allergies  Codeine and Sulfonamide derivatives  Home Medications   Prior  to Admission medications   Medication Sig Start Date End Date Taking? Authorizing Provider  triamcinolone cream (KENALOG) 0.5 % Apply 1 application topically 2 (two) times daily. 05/23/15   Amy Cletis Athens, MD   Meds Ordered and Administered this Visit  Medications - No data to display  BP 143/104 mmHg  Pulse 90  Temp(Src) 98.3 F (36.8 C) (Oral)  Resp 16  SpO2 98% No data found.   Physical Exam  Constitutional: He is oriented to person, place, and time. He appears well-developed and well-nourished. No distress.  Neck: Neck supple.  Cardiovascular: Normal rate, regular rhythm and normal heart sounds.  Exam reveals no gallop.   No murmur heard. Pulmonary/Chest: Effort normal and breath sounds normal. No respiratory distress. He has no wheezes. He has no rales.  Musculoskeletal: He exhibits edema (2-3+ pitting edema bilaterally). He exhibits no tenderness.  Neurological: He is alert and oriented to person, place, and time.    ED Course  Procedures (including critical care time) ED ECG REPORT   Date: 08/11/2015  Rate: 99  Rhythm: normal sinus rhythm  QRS Axis: normal  Intervals: normal  ST/T Wave abnormalities: normal  Conduction Disutrbances:first-degree A-V block   Narrative Interpretation: NSR with normal axis.  He does have q-waves in V1-V3.  No acute ischemic changes.  Old EKG Reviewed: none available  I have personally reviewed the EKG tracing  and agree with the computerized printout as noted.  Labs Review Labs Reviewed - No data to display  Imaging Review Dg Chest 2 View  08/11/2015  CLINICAL DATA:  Shortness of breath and sick 1 month. EXAM: CHEST  2 VIEW COMPARISON:  10/08/2008 FINDINGS: Lungs are somewhat hypoinflated with possible patchy opacification left base. Possible nodular opacity in the retrosternal space. No evidence of effusion. Mild stable cardiomegaly. Remainder the exam is unchanged. IMPRESSION: Possible patchy opacification in the left base which may  be due to infection. Possible nodule opacity over the retrosternal space. Recommend follow-up chest radiograph 3-4 weeks post treatment. If abnormalities persist, chest CT would be warranted. Electronically Signed   By: Marin Olp M.D.   On: 08/11/2015 20:42     MDM   1. Dyspnea on exertion   2. Abnormal EKG    X-ray does show some patchy opacification concerning for infection. However, he has no cough or fever. I suspect his shortness of breath is coming from a cardiac etiology. I spoke with Dr. Terrence Dupont, cardiologist on call. Given that his vital signs are stable and he clinically appears well, okay to follow-up as an outpatient. I did offer transfer to the emergency room for more acute workup, but patient declined. Emphasized importance of calling Dr. Zenia Resides office tomorrow to set up an appointment for additional cardiac evaluation.     Melony Overly, MD 08/11/15 2140

## 2015-09-04 ENCOUNTER — Encounter: Payer: Self-pay | Admitting: Cardiology

## 2015-09-04 DIAGNOSIS — I428 Other cardiomyopathies: Secondary | ICD-10-CM | POA: Diagnosis not present

## 2015-09-04 DIAGNOSIS — I5022 Chronic systolic (congestive) heart failure: Secondary | ICD-10-CM | POA: Insufficient documentation

## 2015-09-04 DIAGNOSIS — I429 Cardiomyopathy, unspecified: Secondary | ICD-10-CM | POA: Insufficient documentation

## 2015-09-04 DIAGNOSIS — I5020 Unspecified systolic (congestive) heart failure: Secondary | ICD-10-CM | POA: Insufficient documentation

## 2015-09-04 DIAGNOSIS — R9431 Abnormal electrocardiogram [ECG] [EKG]: Secondary | ICD-10-CM | POA: Insufficient documentation

## 2015-09-04 HISTORY — DX: Unspecified systolic (congestive) heart failure: I50.20

## 2015-09-04 HISTORY — DX: Cardiomyopathy, unspecified: I42.9

## 2015-09-04 HISTORY — DX: Chronic systolic (congestive) heart failure: I50.22

## 2015-09-04 NOTE — Progress Notes (Signed)
Patient ID: Lucas Willis, male   DOB: 1956-01-30, 60 y.o.   MRN: [id number]  Lucas Willis, Lucas Willis    Date of visit:  09/04/2015 DOB:  Sep 25, 1955    Age:  24 yrs. Medical record number:  [id number]     Account number:  [id number] Primary Care Provider: Aneta Willis ____________________________ CURRENT DIAGNOSES  1. Cardiomyopathy  2. Chronic systolic heart failure  3. Abnormal electrocardiogram [ECG] ____________________________ ALLERGIES  Demerol, Intolerance-unknown  Sulfa (Sulfonamide Antibiotics), Rash ____________________________ MEDICATIONS  1. carvedilol 6.25 mg tablet, 1 p.o. b.i.d.  2. aspirin 81 mg tablet,delayed release, 1 p.o. daily  3. spironolactone 25 mg tablet, qd  4. Entresto 49 mg-51 mg tablet, 1 p.o. b.i.d. ____________________________ CHIEF COMPLAINTS Cardiomyopathy, chest pain, shortness of breath ____________________________ HISTORY OF PRESENT ILLNESS This very nice 60 year old male is seen for cardiac evaluation. The patient has previously been in excellent health and was able to play adult soccer and was to be involved in normal activities without issues. In January of this year he developed an upper respiratory infection and received 2 courses of Levaquin. Following that he developed a tendon rupture and was in an immobilization boot for a while. He then started back with activity but noticed dyspnea with exertion as well as a weight gain. He eventually went to urgent care he was found to have some mild infiltrates and was sent to Dr. Terrence Willis on the basis of an abnormal EKG that suggested a previous anterior infarction. Dr. Terrence Willis diagnosed him with a cardiomyopathy with an EF of around 25% and started him on spironolactone, Entresto and carvedilol. He has continued to have some dyspnea with over activity but has been able to walk again. He has lost about 11 pounds of weight. It was recommended that he have either an exercise test or catheterization but these were  never arranged and  the patient eventually switched care to me. He noted some vague chest discomfort described as soreness in his anterior chest with exertion but does not recall any prolonged severe episodes of chest pain. He was treated for hypertension early in life but more recently his blood pressure has been normal. ____________________________ PAST HISTORY  Past Medical Illnesses:  hypertension;  Cardiovascular Illnesses:  cardiomyopathy(idiopathic);  Surgical Procedures:  shoulder repair, hernia repair;  NYHA Classification:  II;  Canadian Angina Classification:  Class 0: Asymptomatic;  Cardiology Procedures-Invasive:  no history of prior cardiac procedures;  Cardiology Procedures-Noninvasive:  echocardiogram March 2017;  LVEF not documented,   ____________________________ FAMILY HISTORY Brother -- Brother alive and well Father -- Mother dead, Congestive heart failure Sister -- Sister alive and well ____________________________ SOCIAL HISTORY Alcohol Use:  does not use alcohol;  Smoking:  nonsmoker;  Diet:  regular diet;  Lifestyle:  married;  Exercise:  no regular exercise;  Occupation:  Engineer, building services;  Residence:  lives with wife;   ____________________________ REVIEW OF SYSTEMS General:  denies recent weight change, fatique or change in exercise tolerance.  Integumentary:no rashes or new skin lesions. Eyes: denies diplopia, history of glaucoma or visual problems. Ears, Nose, Throat, Mouth:  denies any hearing loss, epistaxis, hoarseness or difficulty speaking. Respiratory: dyspnea with exertion Cardiovascular:  please review HPI Abdominal: denies dyspepsia, GI bleeding, constipation, or diarrhea Genitourinary-Male: no dysuria, urgency, frequency, or nocturia  Musculoskeletal:  denies arthritis, venous insufficiency, or muscle weakness Neurological:  denies headaches, stroke, or TIA Psychiatric:  denies depression or anxiety Hematological/Immunologic:  denies any food allergies, bleeding  disorders. ____________________________ PHYSICAL EXAMINATION VITAL SIGNS  Blood Pressure:  108/76 Sitting, Right arm, regular cuff  , 102/72 Standing, Right arm and regular cuff   Pulse:  86/min. Weight:  198.50 lbs. Height:  70"BMI: 28  Constitutional:  pleasant white male in no acute distress Skin:  warm and dry to touch, no apparent skin lesions, or masses noted. Head:  normocephalic, balding male hair pattern Eyes:  EOMS Intact, PERRLA, C and S clear, Funduscopic exam not done. ENT:  ears, nose and throat reveal no gross abnormalities.  Dentition good. Neck:  supple, without massess. No JVD, thyromegaly or carotid bruits. Carotid upstroke normal. Chest:  normal symmetry, clear to auscultation. Cardiac:  regular rhythm, normal S1 and S2, No S3 or S4, no murmurs, gallops or rubs detected. Abdomen:  abdomen soft,non-tender, no masses, no hepatospenomegaly, or aneurysm noted Peripheral Pulses:  the femoral,dorsalis pedis, and posterior tibial pulses are full and equal bilaterally with no bruits auscultated. Extremities & Back:  no deformities, clubbing, cyanosis, erythema or edema observed. Normal muscle strength and tone. Neurological:  no gross motor or sensory deficits noted, affect appropriate, oriented x3. ____________________________ MOST RECENT LIPID PANEL 08/18/15  CHOL TOTL 166 mg/dl, LDL 106 NM, HDL 32 mg/dl and TRIGLYCER 142 mg/dl ____________________________ IMPRESSIONS/PLAN  1. Cardiomyopathy of undetermined etiology in the setting of a recent viral infection 2. Chronic systolic congestive heart failure 3. 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 The patient's wife was present for these discussions. Recommended that we proceed to cardiac catheterization to measure right heart pressures  and to evaluate for coronary artery disease. Cardiac catheterization was discussed with the patient including risks of myocardial infarction, death, stroke, bleeding, arrhythmia, dye allergy, or renal insufficiency. He understands and is willing to proceed. Possibility of percutaneous intervention at the same setting was also discussed with the patient including risks. We will arrange to have this done early next week. Lab work was done today. EKG shows an abnormal EKG with a previous anterior infarction. ____________________________ TODAYS ORDERS  1. Comprehensive Metabolic Panel: Today  2. Complete Blood Count: Today  3. Draw PT/INR: Today  4. PTT: Today  5. 12 Lead EKG: Today                       ____________________________ Cardiology Physician:  Kerry Hough MD [facility]

## 2015-09-10 ENCOUNTER — Ambulatory Visit: Payer: BLUE CROSS/BLUE SHIELD | Admitting: Cardiology

## 2015-09-11 ENCOUNTER — Ambulatory Visit (HOSPITAL_COMMUNITY)
Admission: RE | Admit: 2015-09-11 | Discharge: 2015-09-11 | Disposition: A | Discharge status: Home / Self Care | Payer: BLUE CROSS/BLUE SHIELD | Source: Ambulatory Visit | Attending: Cardiology | Admitting: Cardiology

## 2015-09-11 ENCOUNTER — Encounter (HOSPITAL_COMMUNITY)
Admission: RE | Disposition: A | Discharge status: Home / Self Care | Payer: Self-pay | Source: Ambulatory Visit | Attending: Cardiology

## 2015-09-11 DIAGNOSIS — I2510 Atherosclerotic heart disease of native coronary artery without angina pectoris: Secondary | ICD-10-CM | POA: Insufficient documentation

## 2015-09-11 DIAGNOSIS — R9431 Abnormal electrocardiogram [ECG] [EKG]: Secondary | ICD-10-CM | POA: Diagnosis present

## 2015-09-11 DIAGNOSIS — Z79899 Other long term (current) drug therapy: Secondary | ICD-10-CM | POA: Insufficient documentation

## 2015-09-11 DIAGNOSIS — Z7982 Long term (current) use of aspirin: Secondary | ICD-10-CM | POA: Insufficient documentation

## 2015-09-11 DIAGNOSIS — I5022 Chronic systolic (congestive) heart failure: Secondary | ICD-10-CM | POA: Diagnosis not present

## 2015-09-11 DIAGNOSIS — I429 Cardiomyopathy, unspecified: Secondary | ICD-10-CM

## 2015-09-11 DIAGNOSIS — I5020 Unspecified systolic (congestive) heart failure: Secondary | ICD-10-CM | POA: Diagnosis present

## 2015-09-11 DIAGNOSIS — E7800 Pure hypercholesterolemia, unspecified: Secondary | ICD-10-CM | POA: Diagnosis present

## 2015-09-11 HISTORY — PX: CARDIAC CATHETERIZATION: SHX172

## 2015-09-11 LAB — POCT I-STAT 3, VENOUS BLOOD GAS (G3P V)
Acid-base deficit: 3 mmol/L — ABNORMAL HIGH (ref 0.0–2.0)
BICARBONATE: 24.6 meq/L — AB (ref 20.0–24.0)
O2 Saturation: 64 %
PCO2 VEN: 51.3 mmHg — AB (ref 45.0–50.0)
PH VEN: 7.289 (ref 7.250–7.300)
PO2 VEN: 37 mmHg (ref 31.0–45.0)
TCO2: 26 mmol/L (ref 0–100)

## 2015-09-11 LAB — POCT I-STAT 3, ART BLOOD GAS (G3+)
Acid-base deficit: 10 mmol/L — ABNORMAL HIGH (ref 0.0–2.0)
Bicarbonate: 18.7 mEq/L — ABNORMAL LOW (ref 20.0–24.0)
O2 SAT: 93 %
PCO2 ART: 49.1 mmHg — AB (ref 35.0–45.0)
PH ART: 7.188 — AB (ref 7.350–7.450)
PO2 ART: 81 mmHg (ref 80.0–100.0)
TCO2: 20 mmol/L (ref 0–100)

## 2015-09-11 SURGERY — RIGHT/LEFT HEART CATH AND CORONARY ANGIOGRAPHY
Anesthesia: LOCAL

## 2015-09-11 MED ORDER — HEPARIN (PORCINE) IN NACL 2-0.9 UNIT/ML-% IJ SOLN
INTRAMUSCULAR | Status: AC
  Filled 2015-09-11: qty 500

## 2015-09-11 MED ORDER — SODIUM CHLORIDE 0.9 % IV SOLN: 250.0000 mL | INTRAVENOUS | Status: DC | PRN

## 2015-09-11 MED ORDER — SODIUM CHLORIDE 0.9% FLUSH: 3.0000 mL | Freq: Two times a day (BID) | INTRAVENOUS | Status: DC

## 2015-09-11 MED ORDER — HEPARIN (PORCINE) IN NACL 2-0.9 UNIT/ML-% IJ SOLN
INTRAMUSCULAR | Status: AC
  Filled 2015-09-11: qty 1000

## 2015-09-11 MED ORDER — IOPAMIDOL (ISOVUE-370) INJECTION 76%
INTRAVENOUS | Status: DC | PRN
  Administered 2015-09-11: 65 mL via INTRA_ARTERIAL

## 2015-09-11 MED ORDER — MIDAZOLAM HCL 2 MG/2ML IJ SOLN
INTRAMUSCULAR | Status: AC
  Filled 2015-09-11: qty 2

## 2015-09-11 MED ORDER — LIDOCAINE HCL (PF) 1 % IJ SOLN
INTRAMUSCULAR | Status: AC
Start: 2015-09-11 — End: 2015-09-11
  Filled 2015-09-11: qty 30

## 2015-09-11 MED ORDER — IOPAMIDOL (ISOVUE-370) INJECTION 76%
INTRAVENOUS | Status: AC
  Filled 2015-09-11: qty 100

## 2015-09-11 MED ORDER — SODIUM CHLORIDE 0.9% FLUSH: 3.0000 mL | INTRAVENOUS | Status: DC | PRN

## 2015-09-11 MED ORDER — FENTANYL CITRATE (PF) 100 MCG/2ML IJ SOLN
INTRAMUSCULAR | Status: AC
  Filled 2015-09-11: qty 2

## 2015-09-11 MED ORDER — ASPIRIN 81 MG PO CHEW
CHEWABLE_TABLET | ORAL | Status: AC
  Filled 2015-09-11: qty 1

## 2015-09-11 MED ORDER — LIDOCAINE HCL (PF) 1 % IJ SOLN
INTRAMUSCULAR | Status: DC | PRN
  Administered 2015-09-11: 3 mL via INTRADERMAL
  Administered 2015-09-11: 2 mL via INTRADERMAL

## 2015-09-11 MED ORDER — SODIUM CHLORIDE 0.9% FLUSH
3.0000 mL | Freq: Two times a day (BID) | INTRAVENOUS | Status: DC
Start: 2015-09-11 — End: 2015-09-11

## 2015-09-11 MED ORDER — FENTANYL CITRATE (PF) 100 MCG/2ML IJ SOLN
INTRAMUSCULAR | Status: DC | PRN
  Administered 2015-09-11: 25 ug via INTRAVENOUS

## 2015-09-11 MED ORDER — HEPARIN SODIUM (PORCINE) 1000 UNIT/ML IJ SOLN
INTRAMUSCULAR | Status: AC
  Filled 2015-09-11: qty 1

## 2015-09-11 MED ORDER — HEPARIN SODIUM (PORCINE) 1000 UNIT/ML IJ SOLN
INTRAMUSCULAR | Status: DC | PRN
  Administered 2015-09-11: 4500 [IU] via INTRAVENOUS

## 2015-09-11 MED ORDER — VERAPAMIL HCL 2.5 MG/ML IV SOLN
INTRAVENOUS | Status: AC
  Filled 2015-09-11: qty 2

## 2015-09-11 MED ORDER — HEPARIN (PORCINE) IN NACL 2-0.9 UNIT/ML-% IJ SOLN
INTRAMUSCULAR | Status: DC | PRN
  Administered 2015-09-11: 12:00:00

## 2015-09-11 MED ORDER — ASPIRIN 81 MG PO CHEW
81.0000 mg | CHEWABLE_TABLET | ORAL | Status: AC
  Administered 2015-09-11: 81 mg via ORAL

## 2015-09-11 MED ORDER — SODIUM CHLORIDE 0.9 % IV SOLN
INTRAVENOUS | Status: DC
  Administered 2015-09-11: 09:00:00 via INTRAVENOUS

## 2015-09-11 MED ORDER — SODIUM CHLORIDE 0.9 % WEIGHT BASED INFUSION: 1.0000 mL/kg/h | INTRAVENOUS | Status: DC

## 2015-09-11 MED ORDER — MIDAZOLAM HCL 2 MG/2ML IJ SOLN
INTRAMUSCULAR | Status: DC | PRN
  Administered 2015-09-11: 2 mg via INTRAVENOUS

## 2015-09-11 MED ORDER — VERAPAMIL HCL 2.5 MG/ML IV SOLN
INTRAVENOUS | Status: DC | PRN
  Administered 2015-09-11: 10 mL via INTRA_ARTERIAL

## 2015-09-11 SURGICAL SUPPLY — 14 items
CATH BALLN WEDGE 5F 110CM (CATHETERS) ×1 IMPLANT
CATH INFINITI 5 FR JL3.5 (CATHETERS) ×1 IMPLANT
CATH INFINITI 5FR ANG PIGTAIL (CATHETERS) ×1 IMPLANT
CATH INFINITI JR4 5F (CATHETERS) ×1 IMPLANT
DEVICE RAD COMP TR BAND LRG (VASCULAR PRODUCTS) ×1 IMPLANT
GLIDESHEATH SLEND SS 6F .021 (SHEATH) ×1 IMPLANT
KIT HEART LEFT (KITS) ×2 IMPLANT
PACK CARDIAC CATHETERIZATION (CUSTOM PROCEDURE TRAY) ×2 IMPLANT
SHEATH FAST CATH BRACH 5F 5CM (SHEATH) ×1 IMPLANT
SYR CONTROL 10ML ANGIOGRAPHIC (SYRINGE) ×1 IMPLANT
SYR MEDRAD MARK V 150ML (SYRINGE) ×2 IMPLANT
TRANSDUCER W/STOPCOCK (MISCELLANEOUS) ×3 IMPLANT
TUBING CIL FLEX 10 FLL-RA (TUBING) ×2 IMPLANT
WIRE SAFE-T 1.5MM-J .035X260CM (WIRE) ×1 IMPLANT

## 2015-09-11 NOTE — Interval H&P Note (Signed)
History and Physical Interval Note:  09/11/2015 10:43 AM  Lucas Willis  has presented today for surgery, with the diagnosis of CARDIOMYOPATHY  The various methods of treatment have been discussed with the patient and family. After consideration of risks, benefits and other options for treatment, the patient has consented to  Procedure(s): Right/Left Heart Cath and Coronary Angiography (N/A) as a surgical intervention .  The patient's history has been reviewed, patient examined, no change in status, stable for surgery.  I have reviewed the patient's chart and labs.  Questions were answered to the patient's satisfaction.    Cath Lab Visit (complete for each Cath Lab visit)  Clinical Evaluation Leading to the Procedure:   ACS: No.  Non-ACS:    Anginal Classification: No Symptoms  Anti-ischemic medical therapy: Minimal Therapy (1 class of medications)  Non-Invasive Test Results: No non-invasive testing performed  Prior CABG: No previous CABG       Lucas Willis 09/11/2015 10:43 AM

## 2015-09-11 NOTE — Discharge Instructions (Signed)
Radial Site Care °Refer to this sheet in the next few weeks. These instructions provide you with information about caring for yourself after your procedure. Your health care provider may also give you more specific instructions. Your treatment has been planned according to current medical practices, but problems sometimes occur. Call your health care provider if you have any problems or questions after your procedure. °WHAT TO EXPECT AFTER THE PROCEDURE °After your procedure, it is typical to have the following: °· Bruising at the radial site that usually fades within 1-2 weeks. °· Blood collecting in the tissue (hematoma) that may be painful to the touch. It should usually decrease in size and tenderness within 1-2 weeks. °HOME CARE INSTRUCTIONS °· Take medicines only as directed by your health care provider. °· You may shower 24-48 hours after the procedure or as directed by your health care provider. Remove the bandage (dressing) and gently wash the site with plain soap and water. Pat the area dry with a clean towel. Do not rub the site, because this may cause bleeding. °· Do not take baths, swim, or use a hot tub until your health care provider approves. °· Check your insertion site every day for redness, swelling, or drainage. °· Do not apply powder or lotion to the site. °· Do not flex or bend the affected arm for 24 hours or as directed by your health care provider. °· Do not push or pull heavy objects with the affected arm for 24 hours or as directed by your health care provider. °· Do not lift over 10 lb (4.5 kg) for 5 days after your procedure or as directed by your health care provider. °· Ask your health care provider when it is okay to: °¨ Return to work or school. °¨ Resume usual physical activities or sports. °¨ Resume sexual activity. °· Do not drive home if you are discharged the same day as the procedure. Have someone else drive you. °· You may drive 24 hours after the procedure unless otherwise  instructed by your health care provider. °· Do not operate machinery or power tools for 24 hours after the procedure. °· If your procedure was done as an outpatient procedure, which means that you went home the same day as your procedure, a responsible adult should be with you for the first 24 hours after you arrive home. °· Keep all follow-up visits as directed by your health care provider. This is important. °SEEK MEDICAL CARE IF: °· You have a fever. °· You have chills. °· You have increased bleeding from the radial site. Hold pressure on the site. °SEEK IMMEDIATE MEDICAL CARE IF: °· You have unusual pain at the radial site. °· You have redness, warmth, or swelling at the radial site. °· You have drainage (other than a small amount of blood on the dressing) from the radial site. °· The radial site is bleeding, and the bleeding does not stop after 30 minutes of holding steady pressure on the site. °· Your arm or hand becomes pale, cool, tingly, or numb. °  °This information is not intended to replace advice given to you by your health care provider. Make sure you discuss any questions you have with your health care provider. °  °Document Released: 06/19/2010 Document Revised: 06/07/2014 Document Reviewed: 12/03/2013 °Elsevier Interactive Patient Education ©2016 [employer] ° °

## 2015-09-11 NOTE — Progress Notes (Signed)
Accepted care for patient post hand off report.

## 2015-09-11 NOTE — H&P (View-Only) (Signed)
Patient ID: Lucas Willis, male   DOB: Mar 09, 1956, 60 y.o.   MRN: [id number]  Lucas, Willis    Date of visit:  09/04/2015 DOB:  14-Aug-1955    Age:  1 yrs. Medical record number:  [id number]     Account number:  [id number] Primary Care Provider: Aneta Willis ____________________________ CURRENT DIAGNOSES  1. Cardiomyopathy  2. Chronic systolic heart failure  3. Abnormal electrocardiogram [ECG] ____________________________ ALLERGIES  Demerol, Intolerance-unknown  Sulfa (Sulfonamide Antibiotics), Rash ____________________________ MEDICATIONS  1. carvedilol 6.25 mg tablet, 1 p.o. b.i.d.  2. aspirin 81 mg tablet,delayed release, 1 p.o. daily  3. spironolactone 25 mg tablet, qd  4. Entresto 49 mg-51 mg tablet, 1 p.o. b.i.d. ____________________________ CHIEF COMPLAINTS Cardiomyopathy, chest pain, shortness of breath ____________________________ HISTORY OF PRESENT ILLNESS This very nice 60 year old male is seen for cardiac evaluation. The patient has previously been in excellent health and was able to play adult soccer and was to be involved in normal activities without issues. In January of this year he developed an upper respiratory infection and received 2 courses of Levaquin. Following that he developed a tendon rupture and was in an immobilization boot for a while. He then started back with activity but noticed dyspnea with exertion as well as a weight gain. He eventually went to urgent care he was found to have some mild infiltrates and was sent to Dr. Terrence Willis on the basis of an abnormal EKG that suggested a previous anterior infarction. Dr. Terrence Willis diagnosed him with a cardiomyopathy with an EF of around 25% and started him on spironolactone, Entresto and carvedilol. He has continued to have some dyspnea with over activity but has been able to walk again. He has lost about 11 pounds of weight. It was recommended that he have either an exercise test or catheterization but these were  never arranged and  the patient eventually switched care to me. He noted some vague chest discomfort described as soreness in his anterior chest with exertion but does not recall any prolonged severe episodes of chest pain. He was treated for hypertension early in life but more recently his blood pressure has been normal. ____________________________ PAST HISTORY  Past Medical Illnesses:  hypertension;  Cardiovascular Illnesses:  cardiomyopathy(idiopathic);  Surgical Procedures:  shoulder repair, hernia repair;  NYHA Classification:  II;  Canadian Angina Classification:  Class 0: Asymptomatic;  Cardiology Procedures-Invasive:  no history of prior cardiac procedures;  Cardiology Procedures-Noninvasive:  echocardiogram March 2017;  LVEF not documented,   ____________________________ FAMILY HISTORY Brother -- Brother alive and well Father -- Mother dead, Congestive heart failure Sister -- Sister alive and well ____________________________ SOCIAL HISTORY Alcohol Use:  does not use alcohol;  Smoking:  nonsmoker;  Diet:  regular diet;  Lifestyle:  married;  Exercise:  no regular exercise;  Occupation:  Engineer, building services;  Residence:  lives with wife;   ____________________________ REVIEW OF SYSTEMS General:  denies recent weight change, fatique or change in exercise tolerance.  Integumentary:no rashes or new skin lesions. Eyes: denies diplopia, history of glaucoma or visual problems. Ears, Nose, Throat, Mouth:  denies any hearing loss, epistaxis, hoarseness or difficulty speaking. Respiratory: dyspnea with exertion Cardiovascular:  please review HPI Abdominal: denies dyspepsia, GI bleeding, constipation, or diarrhea Genitourinary-Male: no dysuria, urgency, frequency, or nocturia  Musculoskeletal:  denies arthritis, venous insufficiency, or muscle weakness Neurological:  denies headaches, stroke, or TIA Psychiatric:  denies depression or anxiety Hematological/Immunologic:  denies any food allergies, bleeding  disorders. ____________________________ PHYSICAL EXAMINATION VITAL SIGNS  Blood Pressure:  108/76 Sitting, Right arm, regular cuff  , 102/72 Standing, Right arm and regular cuff   Pulse:  86/min. Weight:  198.50 lbs. Height:  70"BMI: 28  Constitutional:  pleasant white male in no acute distress Skin:  warm and dry to touch, no apparent skin lesions, or masses noted. Head:  normocephalic, balding male hair pattern Eyes:  EOMS Intact, PERRLA, C and S clear, Funduscopic exam not done. ENT:  ears, nose and throat reveal no gross abnormalities.  Dentition good. Neck:  supple, without massess. No JVD, thyromegaly or carotid bruits. Carotid upstroke normal. Chest:  normal symmetry, clear to auscultation. Cardiac:  regular rhythm, normal S1 and S2, No S3 or S4, no murmurs, gallops or rubs detected. Abdomen:  abdomen soft,non-tender, no masses, no hepatospenomegaly, or aneurysm noted Peripheral Pulses:  the femoral,dorsalis pedis, and posterior tibial pulses are full and equal bilaterally with no bruits auscultated. Extremities & Back:  no deformities, clubbing, cyanosis, erythema or edema observed. Normal muscle strength and tone. Neurological:  no gross motor or sensory deficits noted, affect appropriate, oriented x3. ____________________________ MOST RECENT LIPID PANEL 08/18/15  CHOL TOTL 166 mg/dl, LDL 106 NM, HDL 32 mg/dl and TRIGLYCER 142 mg/dl ____________________________ IMPRESSIONS/PLAN  1. Cardiomyopathy of undetermined etiology in the setting of a recent viral infection 2. Chronic systolic congestive heart failure 3. 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 The patient's wife was present for these discussions. Recommended that we proceed to cardiac catheterization to measure right heart pressures  and to evaluate for coronary artery disease. Cardiac catheterization was discussed with the patient including risks of myocardial infarction, death, stroke, bleeding, arrhythmia, dye allergy, or renal insufficiency. He understands and is willing to proceed. Possibility of percutaneous intervention at the same setting was also discussed with the patient including risks. We will arrange to have this done early next week. Lab work was done today. EKG shows an abnormal EKG with a previous anterior infarction. ____________________________ TODAYS ORDERS  1. Comprehensive Metabolic Panel: Today  2. Complete Blood Count: Today  3. Draw PT/INR: Today  4. PTT: Today  5. 12 Lead EKG: Today                       ____________________________ Cardiology Physician:  Kerry Hough MD [facility]

## 2015-09-12 ENCOUNTER — Encounter (HOSPITAL_COMMUNITY): Payer: Self-pay | Admitting: Cardiology

## 2015-09-18 DIAGNOSIS — R9431 Abnormal electrocardiogram [ECG] [EKG]: Secondary | ICD-10-CM | POA: Diagnosis not present

## 2015-09-18 DIAGNOSIS — I5022 Chronic systolic (congestive) heart failure: Secondary | ICD-10-CM | POA: Diagnosis not present

## 2015-09-18 DIAGNOSIS — I428 Other cardiomyopathies: Secondary | ICD-10-CM | POA: Diagnosis not present

## 2015-09-18 NOTE — Progress Notes (Unsigned)
Late Entry:  Subject was consented to the [place] on 09/11/2015. The study was discussed with the patient and all questions were answered.  The subject signed the Informed Consent and received a copy.  A copy of the Informed Consent was put in the subject's medical records.  No protocol related procedures were conducted prior to informed consent.  Blossom Hoops, RN, Research Coordinator

## 2015-11-06 DIAGNOSIS — I2510 Atherosclerotic heart disease of native coronary artery without angina pectoris: Secondary | ICD-10-CM | POA: Diagnosis not present

## 2015-11-06 DIAGNOSIS — I428 Other cardiomyopathies: Secondary | ICD-10-CM | POA: Diagnosis not present

## 2015-11-06 DIAGNOSIS — R9431 Abnormal electrocardiogram [ECG] [EKG]: Secondary | ICD-10-CM | POA: Diagnosis not present

## 2015-11-06 DIAGNOSIS — I5022 Chronic systolic (congestive) heart failure: Secondary | ICD-10-CM | POA: Diagnosis not present

## 2016-01-26 ENCOUNTER — Encounter: Payer: Self-pay | Admitting: Family Medicine

## 2016-01-26 DIAGNOSIS — I2510 Atherosclerotic heart disease of native coronary artery without angina pectoris: Secondary | ICD-10-CM | POA: Diagnosis not present

## 2016-01-26 DIAGNOSIS — I428 Other cardiomyopathies: Secondary | ICD-10-CM | POA: Diagnosis not present

## 2016-01-26 DIAGNOSIS — I5022 Chronic systolic (congestive) heart failure: Secondary | ICD-10-CM | POA: Diagnosis not present

## 2016-01-26 DIAGNOSIS — E785 Hyperlipidemia, unspecified: Secondary | ICD-10-CM | POA: Diagnosis not present

## 2016-01-29 DIAGNOSIS — I5022 Chronic systolic (congestive) heart failure: Secondary | ICD-10-CM | POA: Diagnosis not present

## 2016-01-29 DIAGNOSIS — I428 Other cardiomyopathies: Secondary | ICD-10-CM | POA: Diagnosis not present

## 2016-07-22 ENCOUNTER — Encounter: Payer: Self-pay | Admitting: Family Medicine

## 2017-07-19 ENCOUNTER — Ambulatory Visit (INDEPENDENT_AMBULATORY_CARE_PROVIDER_SITE_OTHER): Payer: BLUE CROSS/BLUE SHIELD | Admitting: Family Medicine

## 2017-07-19 ENCOUNTER — Other Ambulatory Visit: Payer: Self-pay

## 2017-07-19 ENCOUNTER — Encounter: Payer: Self-pay | Admitting: Family Medicine

## 2017-07-19 VITALS — BP 110/80 | HR 76 | Temp 98.6°F | Ht 68.5 in | Wt 204.0 lb

## 2017-07-19 DIAGNOSIS — Z808 Family history of malignant neoplasm of other organs or systems: Secondary | ICD-10-CM | POA: Diagnosis not present

## 2017-07-19 DIAGNOSIS — I429 Cardiomyopathy, unspecified: Secondary | ICD-10-CM

## 2017-07-19 DIAGNOSIS — I2510 Atherosclerotic heart disease of native coronary artery without angina pectoris: Secondary | ICD-10-CM | POA: Insufficient documentation

## 2017-07-19 DIAGNOSIS — Z0000 Encounter for general adult medical examination without abnormal findings: Secondary | ICD-10-CM | POA: Diagnosis not present

## 2017-07-19 DIAGNOSIS — Z125 Encounter for screening for malignant neoplasm of prostate: Secondary | ICD-10-CM | POA: Diagnosis not present

## 2017-07-19 DIAGNOSIS — E7800 Pure hypercholesterolemia, unspecified: Secondary | ICD-10-CM | POA: Diagnosis not present

## 2017-07-19 DIAGNOSIS — I5022 Chronic systolic (congestive) heart failure: Secondary | ICD-10-CM

## 2017-07-19 DIAGNOSIS — R5383 Other fatigue: Secondary | ICD-10-CM | POA: Insufficient documentation

## 2017-07-19 HISTORY — DX: Atherosclerotic heart disease of native coronary artery without angina pectoris: I25.10

## 2017-07-19 HISTORY — DX: Other fatigue: R53.83

## 2017-07-19 LAB — CBC WITH DIFFERENTIAL/PLATELET
Basophils Absolute: 0 10*3/uL (ref 0.0–0.1)
Basophils Relative: 0.7 % (ref 0.0–3.0)
EOS PCT: 1.7 % (ref 0.0–5.0)
Eosinophils Absolute: 0.1 10*3/uL (ref 0.0–0.7)
HCT: 42.8 % (ref 39.0–52.0)
Hemoglobin: 14.8 g/dL (ref 13.0–17.0)
LYMPHS ABS: 2.1 10*3/uL (ref 0.7–4.0)
Lymphocytes Relative: 32.4 % (ref 12.0–46.0)
MCHC: 34.6 g/dL (ref 30.0–36.0)
MCV: 96.3 fl (ref 78.0–100.0)
MONO ABS: 0.5 10*3/uL (ref 0.1–1.0)
MONOS PCT: 8.4 % (ref 3.0–12.0)
NEUTROS ABS: 3.7 10*3/uL (ref 1.4–7.7)
NEUTROS PCT: 56.8 % (ref 43.0–77.0)
PLATELETS: 247 10*3/uL (ref 150.0–400.0)
RBC: 4.45 Mil/uL (ref 4.22–5.81)
RDW: 13.3 % (ref 11.5–15.5)
WBC: 6.4 10*3/uL (ref 4.0–10.5)

## 2017-07-19 LAB — COMPREHENSIVE METABOLIC PANEL
ALT: 26 U/L (ref 0–53)
AST: 24 U/L (ref 0–37)
Albumin: 4.1 g/dL (ref 3.5–5.2)
Alkaline Phosphatase: 82 U/L (ref 39–117)
BUN: 18 mg/dL (ref 6–23)
CO2: 31 meq/L (ref 19–32)
Calcium: 9.9 mg/dL (ref 8.4–10.5)
Chloride: 102 mEq/L (ref 96–112)
Creatinine, Ser: 1.03 mg/dL (ref 0.40–1.50)
GFR: 77.98 mL/min (ref >60.00)
GLUCOSE: 100 mg/dL — AB (ref 70–99)
POTASSIUM: 4.8 meq/L (ref 3.5–5.1)
SODIUM: 139 meq/L (ref 135–145)
Total Bilirubin: 0.5 mg/dL (ref 0.2–1.2)
Total Protein: 6.9 g/dL (ref 6.0–8.3)

## 2017-07-19 LAB — LIPID PANEL
CHOL/HDL RATIO: 4
Cholesterol: 142 mg/dL (ref 0–200)
HDL: 37.9 mg/dL — ABNORMAL LOW (ref >39.00)
LDL CALC: 77 mg/dL (ref 0–99)
NONHDL: 104.15
Triglycerides: 137 mg/dL (ref 0.0–149.0)
VLDL: 27.4 mg/dL (ref 0.0–40.0)

## 2017-07-19 LAB — PSA: PSA: 1.38 ng/mL (ref 0.10–4.00)

## 2017-07-19 LAB — VITAMIN B12: Vitamin B-12: 592 pg/mL (ref 211–911)

## 2017-07-19 LAB — T4, FREE: Free T4: 0.78 ng/dL (ref 0.60–1.60)

## 2017-07-19 LAB — VITAMIN D 25 HYDROXY (VIT D DEFICIENCY, FRACTURES): VITD: 30.71 ng/mL (ref 30.00–100.00)

## 2017-07-19 LAB — TSH: TSH: 2.03 u[IU]/mL (ref 0.35–4.50)

## 2017-07-19 LAB — T3, FREE: T3, Free: 3.5 pg/mL (ref 2.3–4.2)

## 2017-07-19 NOTE — Assessment & Plan Note (Signed)
Likely viral.. Followed by Cards q 6 months. Has yearly ECHO.

## 2017-07-19 NOTE — Progress Notes (Signed)
Subjective:    Patient ID: Lucas Willis, male    DOB: Oct 29, 1955, 62 y.o.   MRN: [id number]  HPI The patient is here for annual wellness exam and preventative care.    Since last seen in 2016.Marland Kitchen He has had DOE and resulting heart cath on 09/11/2015 showing nonobstructive CAD, severe LV dysfunction  by cardiology Dr. Wynonia Lawman. Dx with systolic heart failure, cardiomyopathy ( EF of 25%) ( unclear etiology but possibly viral) Started on spironolactone, Entresto and carvedilol  Last ECHO 07/2016  EF 43% Has follow up ECHO next month. SOB resolved, no current chest pain. He has been released to go back to soccer.  He has lately in last few months felt very tired. Not getting enough sleep.. Sleeping 6-7 hours night, he is a Management consultant. He has also had a lot of stress.. Lost his daughter last year.. To skin cancer. Approaching  Anniversary of her death. Wife recently injured herself.  He has been more tearful latley.  PHQ9: 6  not interested in referral to counselor or in med to treat.  Due for lab eval.. Will do today. High cholesterol is currently treated with lipitor 20 mg daily  Exercisie: going to gym several times a week  having trouble losing weight .Marland Kitchen No swelling in ankles. Wt Readings from Last 3 Encounters:  07/19/17 204 lb (92.5 kg)  09/11/15 190 lb (86.2 kg)  05/23/15 206 lb (93.4 kg)   Body mass index is 30.57 kg/m.   Social History /Family History/Past Medical History reviewed in detail and updated in EMR if needed. Blood pressure 110/80, pulse 76, temperature 98.6 F (37 C), temperature source Oral, height 5' 8.5" (1.74 m), weight 204 lb (92.5 kg).  Review of Systems  Constitutional: Positive for fatigue. Negative for fever.  HENT: Negative for ear pain.   Eyes: Negative for pain.  Respiratory: Negative for cough and shortness of breath.   Cardiovascular: Negative for chest pain, palpitations and leg swelling.  Gastrointestinal: Negative for abdominal pain.    Genitourinary: Negative for dysuria.  Musculoskeletal: Negative for arthralgias.  Neurological: Negative for syncope, light-headedness and headaches.  Psychiatric/Behavioral: Negative for dysphoric mood.       Objective:   Physical Exam  Constitutional: He appears well-developed and well-nourished.  Non-toxic appearance. He does not appear ill. No distress.  overweight  HENT:  Head: Normocephalic and atraumatic.  Right Ear: Hearing, tympanic membrane, external ear and ear canal normal.  Left Ear: Hearing, tympanic membrane, external ear and ear canal normal.  Nose: Nose normal.  Mouth/Throat: Uvula is midline, oropharynx is clear and moist and mucous membranes are normal.  Eyes: Conjunctivae, EOM and lids are normal. Pupils are equal, round, and reactive to light. Lids are everted and swept, no foreign bodies found.  Neck: Trachea normal, normal range of motion and phonation normal. Neck supple. Carotid bruit is not present. No thyroid mass and no thyromegaly present.  Cardiovascular: Normal rate, regular rhythm, S1 normal, S2 normal, intact distal pulses and normal pulses. Exam reveals no gallop.  No murmur heard. Pulmonary/Chest: Breath sounds normal. He has no wheezes. He has no rhonchi. He has no rales.  Abdominal: Soft. Normal appearance and bowel sounds are normal. There is no hepatosplenomegaly. There is no tenderness. There is no rebound, no guarding and no CVA tenderness. No hernia.  Lymphadenopathy:    He has no cervical adenopathy.  Neurological: He is alert. He has normal strength and normal reflexes. No cranial nerve deficit or sensory deficit. Gait  normal.  Skin: Skin is warm, dry and intact. No rash noted.  Psychiatric: He has a normal mood and affect. His speech is normal and behavior is normal. Judgment normal.          Assessment & Plan:  The patient's preventative maintenance and recommended screening tests for an annual wellness exam were reviewed in full  today. Brought up to date unless services declined.  Counselled on the importance of diet, exercise, and its role in overall health and mortality. The patient's FH and SH was reviewed, including their home life, tobacco status, and drug and alcohol status.    Vaccines: uptodate Prostate Cancer Screen: due Colon Cancer Screen: 12/26/2009 dr. Fuller Plan, sessile polyp, repeat in 10 years      Smoking Status: nonsmoker ETOH/ drug use: none/none  Hep C:  Has been tested in past.abdominal tenderness work  HIV screen:   refused.

## 2017-07-19 NOTE — Assessment & Plan Note (Signed)
IMproved at last check on current regimen.

## 2017-07-19 NOTE — Patient Instructions (Addendum)
Please stop at the lab to have labs drawn. Please stop at the front desk to set up referral.  

## 2017-07-19 NOTE — Assessment & Plan Note (Signed)
Possibly due to chronic stress and mild depression associated with los of daughter.  Eval with labs.

## 2017-07-19 NOTE — Assessment & Plan Note (Signed)
Due for re-eval. 

## 2017-07-22 ENCOUNTER — Encounter: Payer: Self-pay | Admitting: *Deleted

## 2017-07-22 LAB — TESTOS,TOTAL,FREE AND SHBG (FEMALE)
Free Testosterone: 74.9 pg/mL (ref 35.0–155.0)
SEX HORMONE BINDING: 30 nmol/L (ref 22–77)
Testosterone, Total, LC-MS-MS: 433 ng/dL (ref 250–1100)

## 2017-07-26 ENCOUNTER — Telehealth: Payer: Self-pay | Admitting: Family Medicine

## 2017-07-26 NOTE — Telephone Encounter (Signed)
Copied from [place] #[id number]. Topic: Quick Communication - See Telephone Encounter >> Jul 26, 2017  8:46 AM Cleaster Corin, NT wrote: CRM for notification. See Telephone encounter for:   07/26/17. Pt. Calling to receive lab results 07-19-17 No note that pec nt can let pt. Know. Pt. Can be reached at [phone]

## 2017-07-26 NOTE — Telephone Encounter (Signed)
Letter with results was mailed to patient on 07/22/2017.  I called and discussed lab results with patient via telephone.  He also wanted to let Dr. Diona Browner  Know that he forgot to mention at his visit that he has been having night sweats.  He states the occur a couple times a week but when the do happen he has to get up and change clothes.  Wanted to make Dr. Diona Browner aware and to see if she has any concerns or recommendations.  Please Advise.

## 2017-07-26 NOTE — Telephone Encounter (Signed)
Have pt check blood pressure at home  Off and on with and without symptoms. No clear cause of night sweats but if persisting pt can make appt for re-eval in 2 weeks.

## 2017-07-26 NOTE — Telephone Encounter (Signed)
Mr. Roher notified as instructed by telephone.

## 2017-08-02 DIAGNOSIS — D225 Melanocytic nevi of trunk: Secondary | ICD-10-CM | POA: Diagnosis not present

## 2017-08-02 DIAGNOSIS — X32XXXA Exposure to sunlight, initial encounter: Secondary | ICD-10-CM | POA: Diagnosis not present

## 2017-08-02 DIAGNOSIS — Z1283 Encounter for screening for malignant neoplasm of skin: Secondary | ICD-10-CM | POA: Diagnosis not present

## 2017-08-02 DIAGNOSIS — D485 Neoplasm of uncertain behavior of skin: Secondary | ICD-10-CM | POA: Diagnosis not present

## 2017-08-02 DIAGNOSIS — L570 Actinic keratosis: Secondary | ICD-10-CM | POA: Diagnosis not present

## 2017-08-02 DIAGNOSIS — L905 Scar conditions and fibrosis of skin: Secondary | ICD-10-CM | POA: Diagnosis not present

## 2017-08-12 ENCOUNTER — Encounter: Payer: Self-pay | Admitting: Family Medicine

## 2017-08-12 DIAGNOSIS — I2510 Atherosclerotic heart disease of native coronary artery without angina pectoris: Secondary | ICD-10-CM | POA: Diagnosis not present

## 2017-08-12 DIAGNOSIS — E785 Hyperlipidemia, unspecified: Secondary | ICD-10-CM | POA: Diagnosis not present

## 2017-08-12 DIAGNOSIS — I5022 Chronic systolic (congestive) heart failure: Secondary | ICD-10-CM | POA: Diagnosis not present

## 2017-08-12 DIAGNOSIS — I428 Other cardiomyopathies: Secondary | ICD-10-CM | POA: Diagnosis not present

## 2018-02-14 DIAGNOSIS — I428 Other cardiomyopathies: Secondary | ICD-10-CM | POA: Diagnosis not present

## 2018-02-14 DIAGNOSIS — E785 Hyperlipidemia, unspecified: Secondary | ICD-10-CM | POA: Diagnosis not present

## 2018-02-14 DIAGNOSIS — I5022 Chronic systolic (congestive) heart failure: Secondary | ICD-10-CM | POA: Diagnosis not present

## 2018-02-14 DIAGNOSIS — I2510 Atherosclerotic heart disease of native coronary artery without angina pectoris: Secondary | ICD-10-CM | POA: Diagnosis not present

## 2018-02-21 DIAGNOSIS — M1712 Unilateral primary osteoarthritis, left knee: Secondary | ICD-10-CM | POA: Diagnosis not present

## 2018-04-02 ENCOUNTER — Ambulatory Visit (INDEPENDENT_AMBULATORY_CARE_PROVIDER_SITE_OTHER): Payer: BLUE CROSS/BLUE SHIELD

## 2018-04-02 ENCOUNTER — Encounter (HOSPITAL_COMMUNITY): Payer: Self-pay | Admitting: Emergency Medicine

## 2018-04-02 ENCOUNTER — Ambulatory Visit (HOSPITAL_COMMUNITY)
Admission: EM | Admit: 2018-04-02 | Discharge: 2018-04-02 | Disposition: A | Discharge status: Home / Self Care | Payer: BLUE CROSS/BLUE SHIELD | Attending: Family Medicine | Admitting: Family Medicine

## 2018-04-02 ENCOUNTER — Other Ambulatory Visit: Payer: Self-pay

## 2018-04-02 DIAGNOSIS — R509 Fever, unspecified: Secondary | ICD-10-CM

## 2018-04-02 DIAGNOSIS — R05 Cough: Secondary | ICD-10-CM | POA: Diagnosis not present

## 2018-04-02 MED ORDER — ACETAMINOPHEN 325 MG PO TABS
ORAL_TABLET | ORAL | Status: AC
  Filled 2018-04-02: qty 2

## 2018-04-02 MED ORDER — HYDROCODONE-HOMATROPINE 5-1.5 MG/5ML PO SYRP: 5.0000 mL | ORAL_SOLUTION | Freq: Three times a day (TID) | ORAL | 0 refills | Status: DC | PRN

## 2018-04-02 MED ORDER — ACETAMINOPHEN 325 MG PO TABS
650.0000 mg | ORAL_TABLET | Freq: Once | ORAL | Status: AC
  Administered 2018-04-02: 650 mg via ORAL

## 2018-04-02 MED ORDER — OSELTAMIVIR PHOSPHATE 75 MG PO CAPS: 75.0000 mg | ORAL_CAPSULE | Freq: Two times a day (BID) | ORAL | 0 refills | Status: DC

## 2018-04-02 NOTE — ED Triage Notes (Signed)
Bronchitis symptoms, provider at bedside

## 2018-04-02 NOTE — Discharge Instructions (Addendum)
Please monitor your symptoms  Please follow up if your fever doesn't seem to be improving  Please take tylenol for the fever

## 2018-04-02 NOTE — ED Provider Notes (Signed)
[facility]    CSN: [id number] Arrival date & time: 04/02/18  1047     History   Chief Complaint Chief Complaint  Patient presents with  . Bronchitis    HPI Lucas Willis is a 62 y.o. male.   He is presenting with a 3-day history of cough and cold-like symptoms.  He has a fever today and does not feel well.  His wife has been sick recently but she seems to be improving.  He has received a flu vaccine a few weeks ago.  He denies any rashes.  His cough is mildly productive but has been keeping him up at night.  He has been having to sleep in a recliner.  Denies any changes in medications.  He was seen by his cardiologist about a month ago and reports his heart was healthy and his lab work was normal.  HPI  Past Medical History:  Diagnosis Date  . Cardiomyopathy ([place]) 09/04/2015  . Chronic systolic heart failure ([place]) 09/04/2015    Patient Active Problem List   Diagnosis Date Noted  . CAD, nonobstructive on 2017 cath 07/19/2017  . Fatigue 07/19/2017  . Cardiomyopathy ([place]) 09/04/2015  . Heart failure with reduced ejection fraction ([place]) 09/04/2015  . Pure hypercholesterolemia 06/04/2014    Past Surgical History:  Procedure Laterality Date  . CARDIAC CATHETERIZATION N/A 09/11/2015   Procedure: Right/Left Heart Cath and Coronary Angiography;  Surgeon: Peter M Martinique, MD;  Location: [facility];  Service: Cardiovascular;  Laterality: N/A;  . CYST REMOVAL LEG Left 1970   back of L knee  . HERNIA REPAIR Left 1979   inguinal  . PLANTAR'S WART EXCISION  1972  . SHOULDER SURGERY Right 2008   Dislocation Repair       Home Medications    Prior to Admission medications   Medication Sig Start Date End Date Taking? Authorizing Provider  Ascorbic Acid (VITAMIN C PO) Take 2,000 mg by mouth daily.    [provider]  aspirin 81 MG tablet Take 81 mg by mouth daily.    [provider]  atorvastatin (LIPITOR) 20 MG tablet Take 20 mg by mouth daily.     [provider]  b complex vitamins tablet Take 1 tablet by mouth daily.    [provider]  carvedilol (COREG) 6.25 MG tablet Take 6.25 mg by mouth 2 (two) times daily with a meal.    [provider]  cetirizine (ZYRTEC) 10 MG tablet Take 10 mg by mouth daily.    [provider]  Cholecalciferol (D3-1000) 1000 units tablet Take 1,000 Units by mouth daily.    [provider]  Chondroitin Sulfate-Vit C-Mn (CHONDROITIN SULFATE COMPLEX PO) Take 2 tablets by mouth daily.    [provider]  Coenzyme Q10 (COQ10 PO) Take 1 capsule by mouth daily.    [provider]  HYDROcodone-homatropine (HYCODAN) 5-1.5 MG/5ML syrup Take 5 mLs by mouth every 8 (eight) hours as needed for cough. 04/02/18   Rosemarie Ax, MD  MAGNESIUM PO Take 1 tablet by mouth daily.    [provider]  Multiple Vitamins-Minerals (CENTRUM SILVER ULTRA MENS PO) Take 1 tablet by mouth daily.    [provider]  nitroGLYCERIN (NITROSTAT) 0.4 MG SL tablet Place 0.4 mg under the tongue every 5 (five) minutes as needed for chest pain.    [provider]  oseltamivir (TAMIFLU) 75 MG capsule Take 1 capsule (75 mg total) by mouth 2 (two) times  daily. 04/02/18   Rosemarie Ax, MD  sacubitril-valsartan (ENTRESTO) 49-51 MG Take 1 tablet by mouth 2 (two) times daily.    [provider]  spironolactone (ALDACTONE) 25 MG tablet Take 25 mg by mouth daily.    [provider]  VITAMIN E PO Take 1 capsule by mouth daily.    [provider]    Family History Family History  Problem Relation Age of Onset  . Heart failure Mother     Social History Social History   Tobacco Use  . Smoking status: Never Smoker  . Smokeless tobacco: Never Used  Substance Use Topics  . Alcohol use: Yes    Alcohol/week: 0.0 standard drinks    Comment: rare  . Drug use: No     Allergies   Demerol [meperidine] and Sulfonamide  derivatives   Review of Systems Review of Systems  Constitutional: Positive for activity change and fever.  HENT: Positive for sore throat.   Respiratory: Positive for cough.   Cardiovascular: Negative for chest pain.  Gastrointestinal: Negative for abdominal pain.  Musculoskeletal: Negative for back pain.  Skin: Negative for color change.  Neurological: Negative for weakness.  Hematological: Negative for adenopathy.  Psychiatric/Behavioral: Negative for agitation.     Physical Exam Triage Vital Signs ED Triage Vitals [04/02/18 1201]  Enc Vitals Group     BP 121/80     Pulse Rate 90     Resp 18     Temp (!) 102.1 F (38.9 C)     Temp Source Oral     SpO2 94 %     Weight      Height      Head Circumference      Peak Flow      Pain Score      Pain Loc      Pain Edu?      Excl. in [place]?    No data found.  Updated Vital Signs BP 121/80 (BP Location: Left Arm)   Pulse 90   Temp (!) 102.1 F (38.9 C) (Oral)   Resp 18   SpO2 94%   Visual Acuity Right Eye Distance:   Left Eye Distance:   Bilateral Distance:    Right Eye Near:   Left Eye Near:    Bilateral Near:     Physical Exam Gen: NAD, alert, cooperative with exam,  ENT: normal lips, normal nasal mucosa, tympanic membranes clear and intact bilaterally, normal oropharynx, no cervical lymphadenopathy Eye: normal EOM, normal conjunctiva and lids CV:  no edema, +2 pedal pulses, regular rate and rhythm, S1-S2   Resp: no accessory muscle use, non-labored,rhonchorus breath sounds in left lobe, no crackles or wheezes GI: no masses or tenderness, no hernia  Skin: no rashes, no areas of induration  Neuro: normal tone, normal sensation to touch Psych:  normal insight, alert and oriented MSK: Normal gait, normal strength    UC Treatments / Results  Labs (all labs ordered are listed, but only abnormal results are displayed) Labs Reviewed - No data to display  EKG None  Radiology Dg Chest 2 View  Result  Date: 04/02/2018 CLINICAL DATA:  Patient states that he's had cough x 3 days, very little production, fatigue, and fever. Hx of acid reflux, cardiomyopathy, pneumonia. Non-smoker cough EXAM: CHEST - 2 VIEW COMPARISON:  Radiograph 08/11/2015 FINDINGS: Normal mediastinum and cardiac silhouette. Normal pulmonary vasculature. No evidence of effusion, infiltrate, or pneumothorax. No acute bony abnormality. IMPRESSION: No acute cardiopulmonary process. Electronically Signed  By: Suzy Bouchard M.D.   On: 04/02/2018 12:43    Procedures Procedures (including critical care time)  Medications Ordered in UC Medications  acetaminophen (TYLENOL) tablet 650 mg (650 mg Oral Given 04/02/18 1237)    Initial Impression / Assessment and Plan / UC Course  I have reviewed the triage vital signs and the nursing notes.  Pertinent labs & imaging results that were available during my care of the patient were reviewed by me and considered in my medical decision making (see chart for details).     Lynette is a 62 year old male that is presenting with a fever.  He has received a flu vaccine does not demonstrate any other symptoms.  Chest x-ray did not demonstrate infection or fluid overload.  He does not not look significantly sick.  Possible for flu based on symptoms and fever.  Tamiflu was provided.  Provided a cough syrup.  Counseled on supportive care.  Provided indications to follow-up.   Final Clinical Impressions(s) / UC Diagnoses   Final diagnoses:  Fever, unspecified     Discharge Instructions     Please monitor your symptoms  Please follow up if your fever doesn't seem to be improving  Please take tylenol for the fever      ED Prescriptions    Medication Sig Dispense Auth. Provider   HYDROcodone-homatropine (HYCODAN) 5-1.5 MG/5ML syrup Take 5 mLs by mouth every 8 (eight) hours as needed for cough. 60 mL Rosemarie Ax, MD   oseltamivir (TAMIFLU) 75 MG capsule Take 1 capsule (75 mg total) by  mouth 2 (two) times daily. 10 capsule Rosemarie Ax, MD     Controlled Substance Prescriptions [place] Controlled Substance Registry consulted? Yes, I have consulted the [place] Controlled Substances Registry for this patient, and feel the risk/benefit ratio today is favorable for proceeding with this prescription for a controlled substance.   Rosemarie Ax, MD 04/03/18 2138

## 2018-04-02 NOTE — ED Notes (Signed)
ED Provider at bedside. 

## 2018-04-05 DIAGNOSIS — M1712 Unilateral primary osteoarthritis, left knee: Secondary | ICD-10-CM | POA: Insufficient documentation

## 2018-04-05 HISTORY — DX: Unilateral primary osteoarthritis, left knee: M17.12

## 2018-04-10 ENCOUNTER — Other Ambulatory Visit: Payer: Self-pay

## 2018-04-10 ENCOUNTER — Telehealth: Payer: Self-pay | Admitting: Cardiology

## 2018-04-10 DIAGNOSIS — E7800 Pure hypercholesterolemia, unspecified: Secondary | ICD-10-CM

## 2018-04-10 NOTE — Telephone Encounter (Signed)
° ° °  1. Which medications need to be refilled? (please list name of each medication and dose if known) atorvastatin 20mg  tablet 1QD  2. Which pharmacy/location (including street and city if local pharmacy) is medication to [facility] sent to?[id number]  3. Do they need a 30 day or 90 day supply? [name]

## 2018-04-10 NOTE — Telephone Encounter (Signed)
[facility].

## 2018-04-11 DIAGNOSIS — E7800 Pure hypercholesterolemia, unspecified: Secondary | ICD-10-CM | POA: Diagnosis not present

## 2018-04-11 LAB — LIPID PANEL
Cholesterol: 157 mg/dL (ref <200)
HDL: 35 mg/dL — AB (ref >40)
LDL Cholesterol (Calc): 89 mg/dL (calc)
NON-HDL CHOLESTEROL (CALC): 122 mg/dL (ref <130)
TRIGLYCERIDES: 249 mg/dL — AB (ref <150)
Total CHOL/HDL Ratio: 4.5 (calc) (ref <5.0)

## 2018-04-12 ENCOUNTER — Telehealth: Payer: Self-pay | Admitting: Cardiology

## 2018-04-12 DIAGNOSIS — M1712 Unilateral primary osteoarthritis, left knee: Secondary | ICD-10-CM | POA: Diagnosis not present

## 2018-04-12 LAB — HEPATIC FUNCTION PANEL
AG Ratio: 1.5 (calc) (ref 1.0–2.5)
ALBUMIN MSPROF: 4 g/dL (ref 3.6–5.1)
ALT: 27 U/L (ref 9–46)
AST: 25 U/L (ref 10–35)
Alkaline phosphatase (APISO): 71 U/L (ref 40–115)
BILIRUBIN DIRECT: 0.1 mg/dL (ref 0.0–0.2)
GLOBULIN: 2.6 g/dL (ref 1.9–3.7)
Indirect Bilirubin: 0.3 mg/dL (calc) (ref 0.2–1.2)
TOTAL PROTEIN: 6.6 g/dL (ref 6.1–8.1)
Total Bilirubin: 0.4 mg/dL (ref 0.2–1.2)

## 2018-04-14 ENCOUNTER — Other Ambulatory Visit: Payer: Self-pay

## 2018-04-14 DIAGNOSIS — E7800 Pure hypercholesterolemia, unspecified: Secondary | ICD-10-CM

## 2018-04-14 MED ORDER — ATORVASTATIN CALCIUM 20 MG PO TABS: 20.0000 mg | ORAL_TABLET | Freq: Every day | ORAL | 0 refills | Status: DC

## 2018-04-14 NOTE — Telephone Encounter (Signed)
Informed patient of results; informed to call the office with any further questions or concerns. Patient was instructed that fasting labs would need to be repeated in 6 weeks per Revankar.

## 2018-04-19 DIAGNOSIS — M1712 Unilateral primary osteoarthritis, left knee: Secondary | ICD-10-CM | POA: Diagnosis not present

## 2018-04-24 ENCOUNTER — Other Ambulatory Visit: Payer: Self-pay | Admitting: Cardiology

## 2018-04-24 MED ORDER — ASPIRIN 81 MG PO TABS: 81.0000 mg | ORAL_TABLET | Freq: Every day | ORAL | 0 refills | Status: DC

## 2018-04-24 NOTE — Telephone Encounter (Signed)
° ° ° °  1. Which medications need to be refilled? (please list name of each medication and dose if known) [facility] 81mg  tablet 1QD  2. Which pharmacy/location (including street and city if local pharmacy) is medication to be sent to? [facility] [address] [facility]  3. Do they need a 30 day or 90 day supply?100

## 2018-04-24 NOTE — Telephone Encounter (Signed)
Aspirin 81 mg daily refilled per Dr. Agustin Cree.

## 2018-05-01 NOTE — Telephone Encounter (Signed)
done

## 2018-07-14 ENCOUNTER — Other Ambulatory Visit: Payer: Self-pay | Admitting: Cardiology

## 2018-07-14 NOTE — Telephone Encounter (Signed)
[facility] [place] with no refills. Patient is scheduled for an appointment with Dr. Agustin Cree on 07/21/2018 in the [facility] office. Further refills will be during follow up appointment.

## 2018-07-16 ENCOUNTER — Other Ambulatory Visit: Payer: Self-pay | Admitting: Cardiology

## 2018-07-21 ENCOUNTER — Ambulatory Visit: Payer: BLUE CROSS/BLUE SHIELD | Admitting: Cardiology

## 2018-07-21 VITALS — BP 110/64 | HR 78 | Ht 70.0 in | Wt 211.0 lb

## 2018-07-21 DIAGNOSIS — I429 Cardiomyopathy, unspecified: Secondary | ICD-10-CM

## 2018-07-21 DIAGNOSIS — I5022 Chronic systolic (congestive) heart failure: Secondary | ICD-10-CM | POA: Diagnosis not present

## 2018-07-21 DIAGNOSIS — I2510 Atherosclerotic heart disease of native coronary artery without angina pectoris: Secondary | ICD-10-CM

## 2018-07-21 DIAGNOSIS — R5383 Other fatigue: Secondary | ICD-10-CM

## 2018-07-21 MED ORDER — SPIRONOLACTONE 25 MG PO TABS: 25.0000 mg | ORAL_TABLET | Freq: Every day | ORAL | 3 refills | Status: DC

## 2018-07-21 MED ORDER — SACUBITRIL-VALSARTAN 49-51 MG PO TABS: 1.0000 | ORAL_TABLET | Freq: Two times a day (BID) | ORAL | 3 refills | Status: DC

## 2018-07-21 MED ORDER — ATORVASTATIN CALCIUM 20 MG PO TABS: 20.0000 mg | ORAL_TABLET | Freq: Every day | ORAL | 3 refills | Status: DC

## 2018-07-21 MED ORDER — CARVEDILOL 6.25 MG PO TABS: 6.2500 mg | ORAL_TABLET | Freq: Two times a day (BID) | ORAL | 3 refills | Status: DC

## 2018-07-21 NOTE — Patient Instructions (Signed)
Medication Instructions:  Your physician recommends that you continue on your current medications as directed. Please refer to the Current Medication list given to you today.  If you need a refill on your cardiac medications before your next appointment, please call your pharmacy.   Lab work: None ordered If you have labs (blood work) drawn today and your tests are completely normal, you will receive your results only by: Marland Kitchen MyChart Message (if you have MyChart) OR . A paper copy in the mail If you have any lab test that is abnormal or we need to change your treatment, we will call you to review the results.  Testing/Procedures: Your physician has requested that you have an echocardiogram. Echocardiography is a painless test that uses sound waves to create images of your heart. It provides your doctor with information about the size and shape of your heart and how well your heart's chambers and valves are working. This procedure takes approximately one hour. There are no restrictions for this procedure.  EKG Today  Follow-Up: At [facility], you and your health needs are our priority.  As part of our continuing mission to provide you with exceptional heart care, we have created designated Provider Care Teams.  These Care Teams include your primary Cardiologist (physician) and Advanced Practice Providers (APPs -  Physician Assistants and Nurse Practitioners) who all work together to provide you with the care you need, when you need it. You will need a follow up appointment in 12 months.  Please call our office 2 months in advance to schedule this appointment.  You may see Jenne Campus or another member of our [employer] Provider Team in [place]: Shirlee More, MD . Jyl Heinz, MD

## 2018-07-21 NOTE — Progress Notes (Signed)
Cardiology Office Note:    Date:  07/21/2018   ID:  ZARION OLIFF, DOB [birth year], MRN [id number]  PCP:  Jinny Sanders, MD  Cardiologist:  Jenne Campus, MD    Referring MD: Jinny Sanders, MD   Chief Complaint  Patient presents with  . Follow-up  Doing well  History of Present Illness:    Lucas Willis is a 64 y.o. 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  He has no difficulty doing due to issues that he brought up today.  First is the fact that he is getting cramps in the right ankle he try some mustard's and actually helped laboratory tests were done and all were negative.  Another issue is the fact that he started having some issue with the memory this is usually problem the short-term memory.  Cardiac wise no chest pain tightness squeezing pressure burning chest he does have some shortness of breath with exertion.  Past Medical History:  Diagnosis Date  . Cardiomyopathy ([place]) 09/04/2015  . Chronic systolic heart failure ([place]) 09/04/2015    Past Surgical History:  Procedure Laterality Date  . CARDIAC CATHETERIZATION N/A 09/11/2015   Procedure: Right/Left Heart Cath and Coronary Angiography;  Surgeon: Peter M Martinique, MD;  Location: [place] [facility];  Service: Cardiovascular;  Laterality: N/A;  . CYST REMOVAL LEG Left 1970   back of L knee  . HERNIA REPAIR Left 1979   inguinal  . PLANTAR'S WART EXCISION  1972  . SHOULDER SURGERY Right 2008   Dislocation Repair    Current Medications: No outpatient medications have been marked as taking for the 07/21/18 encounter (Office Visit) with Park Liter, MD.     Allergies:   Demerol [meperidine] and Sulfonamide derivatives   Social History   Socioeconomic History    . Marital status: Married    Spouse name: [name]  . Number of children: 2  . Years of education: Not on file  . Highest education level: Not on file  Occupational History  . Occupation: Art therapist  Social Needs  . Financial resource strain: Not on file  . Food insecurity:    Worry: Not on file    Inability: Not on file  . Transportation needs:    Medical: Not on file    Non-medical: Not on file  Tobacco Use  . Smoking status: Never Smoker  . Smokeless tobacco: Never Used  Substance and Sexual Activity  . Alcohol use: Yes    Alcohol/week: 0.0 standard drinks    Comment: rare  . Drug use: No  . Sexual activity: Yes  Lifestyle  . Physical activity:    Days per week: Not on file    Minutes per session: Not on file  . Stress: Not on file  Relationships  . Social connections:    Talks on phone: Not on file    Gets together: Not on file    Attends religious service: Not on file    Active member of club or organization: Not on file    Attends meetings of clubs or organizations: Not on file    Relationship status: Not on file  Other Topics Concern  . Not on file  Social History Narrative   2 kids, 18  and 35 year old.    Married     Family History: The patient's family history includes Heart failure in his mother. ROS:   Please see the history of present illness.    All 14 point review of systems negative except as described per history of present illness  EKGs/Labs/Other Studies Reviewed:      Recent Labs: 04/11/2018: ALT 27  Recent Lipid Panel    Component Value Date/Time   CHOL 157 04/11/2018 0734   TRIG 249 (H) 04/11/2018 0734   TRIG 258 (HH) 06/08/2006 1002   HDL 35 (L) 04/11/2018 0734   CHOLHDL 4.5 04/11/2018 0734   VLDL 27.4 07/19/2017 0845   LDLCALC 89 04/11/2018 0734   LDLDIRECT 142.0 09/10/2009 1556    Physical Exam:    VS:  BP 110/64   Pulse 78   Ht 5\' 10"  (8.466 m)   Wt 211 lb (95.7 kg)   SpO2 98%   BMI 30.28 kg/m     Wt  Readings from Last 3 Encounters:  07/21/18 211 lb (95.7 kg)  07/19/17 204 lb (92.5 kg)  09/11/15 190 lb (86.2 kg)     GEN:  Well nourished, well developed in no acute distress HEENT: Normal NECK: No JVD; No carotid bruits LYMPHATICS: No lymphadenopathy CARDIAC: RRR, no murmurs, no rubs, no gallops RESPIRATORY:  Clear to auscultation without rales, wheezing or rhonchi  ABDOMEN: Soft, non-tender, non-distended MUSCULOSKELETAL:  No edema; No deformity  SKIN: Warm and dry LOWER EXTREMITIES: no swelling NEUROLOGIC:  Alert and oriented x 3 PSYCHIATRIC:  Normal affect   ASSESSMENT:    1. Cardiomyopathy, unspecified type ([place])   2. 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 Fatigue, unspecified type   4. Chronic systolic heart failure (HCC)    PLAN:    In order of problems listed above:  1. Cardiomyopathy which is nonischemic in origin.  Will repeat his echocardiogram to recheck left ventricular ejection fraction.  EKG will be done today as well. 2. Coronary artery disease nonobstructive by cardiac catheterization from 2017 risk factors modification aspirin and statin which I will continue. 3. Fatigue.  Stable. 4. Chronic systolic congestive heart failure.  New York Heart Association class I/II.   Medication Adjustments/Labs and Tests Ordered: Current medicines are reviewed at length with the patient today.  Concerns regarding medicines are outlined above.  No orders of the defined types were placed in this encounter.  Medication changes: No orders of the defined types were placed in this encounter.   Signed, Park Liter, MD, [facility] 07/21/2018 12:34 PM    [place]

## 2018-07-25 ENCOUNTER — Ambulatory Visit (HOSPITAL_COMMUNITY): Payer: BLUE CROSS/BLUE SHIELD | Attending: Cardiovascular Disease

## 2018-07-25 DIAGNOSIS — I2510 Atherosclerotic heart disease of native coronary artery without angina pectoris: Secondary | ICD-10-CM

## 2018-07-25 DIAGNOSIS — I429 Cardiomyopathy, unspecified: Secondary | ICD-10-CM | POA: Insufficient documentation

## 2018-07-25 DIAGNOSIS — I5022 Chronic systolic (congestive) heart failure: Secondary | ICD-10-CM | POA: Insufficient documentation

## 2018-07-25 MED ORDER — PERFLUTREN LIPID MICROSPHERE
1.0000 mL | INTRAVENOUS | Status: AC | PRN
  Administered 2018-07-25: 2 mL via INTRAVENOUS

## 2019-04-19 ENCOUNTER — Encounter: Payer: Self-pay | Admitting: Podiatry

## 2019-04-19 ENCOUNTER — Other Ambulatory Visit: Payer: Self-pay

## 2019-04-19 ENCOUNTER — Ambulatory Visit: Payer: BLUE CROSS/BLUE SHIELD | Admitting: Podiatry

## 2019-04-19 ENCOUNTER — Other Ambulatory Visit: Payer: Self-pay | Admitting: Podiatry

## 2019-04-19 ENCOUNTER — Ambulatory Visit (INDEPENDENT_AMBULATORY_CARE_PROVIDER_SITE_OTHER): Payer: BC Managed Care – PPO

## 2019-04-19 VITALS — BP 124/85 | HR 80 | Resp 16

## 2019-04-19 DIAGNOSIS — M7671 Peroneal tendinitis, right leg: Secondary | ICD-10-CM

## 2019-04-19 DIAGNOSIS — M7751 Other enthesopathy of right foot: Secondary | ICD-10-CM

## 2019-04-19 DIAGNOSIS — M7672 Peroneal tendinitis, left leg: Secondary | ICD-10-CM

## 2019-04-19 DIAGNOSIS — G5781 Other specified mononeuropathies of right lower limb: Secondary | ICD-10-CM

## 2019-04-19 DIAGNOSIS — G5761 Lesion of plantar nerve, right lower limb: Secondary | ICD-10-CM

## 2019-04-21 NOTE — Progress Notes (Signed)
Subjective:  Patient ID: Lucas Willis, male    DOB: 10/08/1955,  MRN: [id number] HPI Chief Complaint  Patient presents with  . Foot Pain    Lateral and anterior ankle and plantar forefoot right - aching intermittently x months, swelling, also notices 3rd and 4th toes are separating, active in soccer  . New Patient (Initial Visit)    63 y.o. male presents with the above complaint.   ROS: Denies fever chills nausea vomiting muscle aches pains calf pain back pain chest pain shortness of breath.  Past Medical History:  Diagnosis Date  . Cardiomyopathy ([place]) 09/04/2015  . Chronic systolic heart failure ([place]) 09/04/2015   Past Surgical History:  Procedure Laterality Date  . CARDIAC CATHETERIZATION N/A 09/11/2015   Procedure: Right/Left Heart Cath and Coronary Angiography;  Surgeon: Peter M Martinique, MD;  Location: [place] [facility];  Service: Cardiovascular;  Laterality: N/A;  . CYST REMOVAL LEG Left 1970   back of L knee  . HERNIA REPAIR Left 1979   inguinal  . PLANTAR'S WART EXCISION  1972  . SHOULDER SURGERY Right 2008   Dislocation Repair    Current Outpatient Medications:  .  Ascorbic Acid (VITAMIN C PO), Take 2,000 mg by mouth daily., Disp: , Rfl:  .  ASPIRIN ADULT LOW STRENGTH 81 MG EC tablet, TAKE 1 TABLET BY MOUTH EVERY DAY, Disp: 90 tablet, Rfl: 3 .  atorvastatin (LIPITOR) 20 MG tablet, Take 1 tablet (20 mg total) by mouth daily., Disp: 90 tablet, Rfl: 3 .  b complex vitamins tablet, Take 1 tablet by mouth daily., Disp: , Rfl:  .  carvedilol (COREG) 6.25 MG tablet, Take 1 tablet (6.25 mg total) by mouth 2 (two) times daily with a meal., Disp: 180 tablet, Rfl: 3 .  cetirizine (ZYRTEC) 10 MG tablet, Take 10 mg by mouth daily., Disp: , Rfl:  .  Cholecalciferol (D3-1000) 1000 units tablet, Take 1,000 Units by mouth daily., Disp: , Rfl:  .  Chondroitin Sulfate-Vit C-Mn (CHONDROITIN SULFATE COMPLEX PO), Take 2 tablets by mouth daily., Disp: , Rfl:  .  Coenzyme Q10 (COQ10 PO),  Take 1 capsule by mouth daily., Disp: , Rfl:  .  MAGNESIUM PO, Take 1 tablet by mouth daily., Disp: , Rfl:  .  Multiple Vitamins-Minerals (CENTRUM SILVER ULTRA MENS PO), Take 1 tablet by mouth daily., Disp: , Rfl:  .  nitroGLYCERIN (NITROSTAT) 0.4 MG SL tablet, Place 0.4 mg under the tongue every 5 (five) minutes as needed for chest pain., Disp: , Rfl:  .  sacubitril-valsartan (ENTRESTO) 49-51 MG, Take 1 tablet by mouth 2 (two) times daily., Disp: 180 tablet, Rfl: 3 .  spironolactone (ALDACTONE) 25 MG tablet, Take 1 tablet (25 mg total) by mouth daily., Disp: 90 tablet, Rfl: 3 .  VITAMIN E PO, Take 1 capsule by mouth daily., Disp: , Rfl:   Allergies  Allergen Reactions  . Demerol [Meperidine] Other (See Comments)    Face numb, hyperventilating, fingers went rigid  . Sulfonamide Derivatives     REACTION: unspecified   Review of Systems Objective:   Vitals:   04/19/19 0859  BP: 124/85  Pulse: 80  Resp: 16    General: Well developed, nourished, in no acute distress, alert and oriented x3   Dermatological: Skin is warm, dry and supple bilateral. Nails x 10 are well maintained; remaining integument appears unremarkable at this time. There are no open sores, no preulcerative lesions, no rash or signs of infection present.  Vascular: Dorsalis Pedis artery  and Posterior Tibial artery pedal pulses are 2/4 bilateral with immedate capillary fill time. Pedal hair growth present. No varicosities and no lower extremity edema present bilateral.   Neruologic: Grossly intact via light touch bilateral. Vibratory intact via tuning fork bilateral. Protective threshold with Semmes Wienstein monofilament intact to all pedal sites bilateral. Patellar and Achilles deep tendon reflexes 2+ bilateral. No Babinski or clonus noted bilateral.  Palpable neuroma third interdigital space of the left foot  Musculoskeletal: No gross boney pedal deformities bilateral. No pain, crepitus, or limitation noted with foot  and ankle range of motion bilateral. Muscular strength 5/5 in all groups tested bilateral.  Soft tissue swelling and a palpable fluctuance infra malleolar region of the peroneal tendon sheath left.  Gait: Unassisted, Nonantalgic.    Radiographs:  Radiographs taken today demonstrate no significant osseous abnormalities.  Assessment & Plan:   Assessment: Peroneal te acted ndinitis left.  Neuroma left.    Plan: Discussed etiology pathology conservative versus surgical therapies after sterile Betadine skin prep I injected 20 mg Kenalog 5 mg Marcaine point of maximal tenderness third interdigital space of the left foot.  Also injected 10 mg into the tendon sheath of the peroneal tendons left.  Discussed appropriate shoe gear stretching exercises ice therapy sugar modifications and I will follow-up with him in about a month.      T. [place], [place]

## 2019-06-01 HISTORY — PX: KNEE ARTHROSCOPY: SHX127

## 2019-07-23 ENCOUNTER — Ambulatory Visit (INDEPENDENT_AMBULATORY_CARE_PROVIDER_SITE_OTHER): Payer: BC Managed Care – PPO | Admitting: Cardiology

## 2019-07-23 ENCOUNTER — Other Ambulatory Visit: Payer: Self-pay

## 2019-07-23 ENCOUNTER — Encounter: Payer: Self-pay | Admitting: Cardiology

## 2019-07-23 VITALS — BP 108/74 | HR 73 | Ht 70.0 in | Wt 217.0 lb

## 2019-07-23 DIAGNOSIS — E7800 Pure hypercholesterolemia, unspecified: Secondary | ICD-10-CM | POA: Diagnosis not present

## 2019-07-23 DIAGNOSIS — R5383 Other fatigue: Secondary | ICD-10-CM | POA: Diagnosis not present

## 2019-07-23 DIAGNOSIS — I2510 Atherosclerotic heart disease of native coronary artery without angina pectoris: Secondary | ICD-10-CM | POA: Diagnosis not present

## 2019-07-23 DIAGNOSIS — I429 Cardiomyopathy, unspecified: Secondary | ICD-10-CM

## 2019-07-23 NOTE — Patient Instructions (Signed)
Medication Instructions:  Your physician recommends that you continue on your current medications as directed. Please refer to the Current Medication list given to you today.  *If you need a refill on your cardiac medications before your next appointment, please call your pharmacy*  Lab Work: Your physician recommends that you return for lab work today: lipids, bmp   If you have labs (blood work) drawn today and your tests are completely normal, you will receive your results only by: Marland Kitchen MyChart Message (if you have MyChart) OR . A paper copy in the mail If you have any lab test that is abnormal or we need to change your treatment, we will call you to review the results.  Testing/Procedures: Your physician has requested that you have an echocardiogram. Echocardiography is a painless test that uses sound waves to create images of your heart. It provides your doctor with information about the size and shape of your heart and how well your heart's chambers and valves are working. This procedure takes approximately one hour. There are no restrictions for this procedure.    Follow-Up: At [facility], you and your health needs are our priority.  As part of our continuing mission to provide you with exceptional heart care, we have created designated Provider Care Teams.  These Care Teams include your primary Cardiologist (physician) and Advanced Practice Providers (APPs -  Physician Assistants and Nurse Practitioners) who all work together to provide you with the care you need, when you need it.  Your next appointment:   1 year(s)  The format for your next appointment:   In Person  Provider:   You may see No primary care provider on file. or the following Advanced Practice Provider on your designated Care Team:    Laurann Montana, FNP   Other Instructions   Echocardiogram An echocardiogram is a procedure that uses painless sound waves (ultrasound) to produce an image of the heart.  Images from an echocardiogram can provide important information about:  Signs of coronary artery disease (CAD).  Aneurysm detection. An aneurysm is a weak or damaged part of an artery wall that bulges out from the normal force of blood pumping through the body.  Heart size and shape. Changes in the size or shape of the heart can be associated with certain conditions, including heart failure, aneurysm, and CAD.  Heart muscle function.  Heart valve function.  Signs of a past heart attack.  Fluid buildup around the heart.  Thickening of the heart muscle.  A tumor or infectious growth around the heart valves. Tell a health care provider about:  Any allergies you have.  All medicines you are taking, including vitamins, herbs, eye drops, creams, and over-the-counter medicines.  Any blood disorders you have.  Any surgeries you have had.  Any medical conditions you have.  Whether you are pregnant or may be pregnant. What are the risks? Generally, this is a safe procedure. However, problems may occur, including:  Allergic reaction to dye (contrast) that may be used during the procedure. What happens before the procedure? No specific preparation is needed. You may eat and drink normally. What happens during the procedure?   An IV tube may be inserted into one of your veins.  You may receive contrast through this tube. A contrast is an injection that improves the quality of the pictures from your heart.  A gel will be applied to your chest.  A wand-like tool (transducer) will be moved over your chest. The gel will  help to transmit the sound waves from the transducer.  The sound waves will harmlessly bounce off of your heart to allow the heart images to be captured in real-time motion. The images will be recorded on a computer. The procedure may vary among health care providers and hospitals. What happens after the procedure?  You may return to your normal, everyday life,  including diet, activities, and medicines, unless your health care provider tells you not to do that. Summary  An echocardiogram is a procedure that uses painless sound waves (ultrasound) to produce an image of the heart.  Images from an echocardiogram can provide important information about the size and shape of your heart, heart muscle function, heart valve function, and fluid buildup around your heart.  You do not need to do anything to prepare before this procedure. You may eat and drink normally.  After the echocardiogram is completed, you may return to your normal, everyday life, unless your health care provider tells you not to do that. This information is not intended to replace advice given to you by your health care provider. Make sure you discuss any questions you have with your health care provider. Document Revised: 09/07/2018 Document Reviewed: 06/19/2016 Elsevier Patient Education  [place].

## 2019-07-23 NOTE — Progress Notes (Signed)
Cardiology Office Note:    Date:  07/23/2019   ID:  Lucas Willis, DOB March 26, 1956, MRN [id number]  PCP:  Lucas Sanders, MD  Cardiologist:  Lucas Campus, MD    Referring MD: Lucas Sanders, MD   No chief complaint on file. Doing well  History of Present Illness:    Lucas Willis is a 64 y.o. male this is a yearly follow-up for this gentleman with history of nonischemic cardiomyopathy, which is nonischemic, last estimation of left ventricle ejection fraction done in February 2020 showed ejection fraction 50 to 55%, cardiac catheterization done in 2017 showed only luminal disease.  Comes today to my office for follow-up.  Overall seems to be doing well.  Still works a lot some tiredness and fatigue but otherwise doing well.  Denies have any chest pain, tightness, pressure, burning in the chest.  We talked a lot today about coughing team situation.  He is wearing mask and thinking about potentially taking vaccine.  Past Medical History:  Diagnosis Date  . Cardiomyopathy ([place]) 09/04/2015  . Chronic systolic heart failure ([place]) 09/04/2015    Past Surgical History:  Procedure Laterality Date  . CARDIAC CATHETERIZATION N/A 09/11/2015   Procedure: Right/Left Heart Cath and Coronary Angiography;  Surgeon: Lucas M Martinique, MD;  Location: [facility];  Service: Cardiovascular;  Laterality: N/A;  . CYST REMOVAL LEG Left 1970   back of L knee  . HERNIA REPAIR Left 1979   inguinal  . PLANTAR'S WART EXCISION  1972  . SHOULDER SURGERY Right 2008   Dislocation Repair    Current Medications: Current Meds  Medication Sig  . Ascorbic Acid (VITAMIN C PO) Take 2,000 mg by mouth daily.  . ASPIRIN ADULT LOW STRENGTH 81 MG EC tablet TAKE 1 TABLET BY MOUTH EVERY DAY  . atorvastatin (LIPITOR) 20 MG tablet Take 1 tablet (20 mg total) by mouth daily.  Marland Kitchen b complex vitamins tablet Take 1 tablet by mouth daily.  . carvedilol (COREG) 6.25 MG tablet Take 1 tablet (6.25 mg total) by mouth 2 (two)  times daily with a meal.  . cetirizine (ZYRTEC) 10 MG tablet Take 10 mg by mouth daily.  . Cholecalciferol (D3-1000) 1000 units tablet Take 1,000 Units by mouth daily.  . Chondroitin Sulfate-Vit C-Mn (CHONDROITIN SULFATE COMPLEX PO) Take 2 tablets by mouth daily.  . Coenzyme Q10 (COQ10 PO) Take 1 capsule by mouth daily.  Marland Kitchen MAGNESIUM PO Take 1 tablet by mouth daily.  . Multiple Vitamins-Minerals (CENTRUM SILVER ULTRA MENS PO) Take 1 tablet by mouth daily.  . nitroGLYCERIN (NITROSTAT) 0.4 MG SL tablet Place 0.4 mg under the tongue every 5 (five) minutes as needed for chest pain.  . sacubitril-valsartan (ENTRESTO) 49-51 MG Take 1 tablet by mouth 2 (two) times daily.  Marland Kitchen spironolactone (ALDACTONE) 25 MG tablet Take 1 tablet (25 mg total) by mouth daily.  Marland Kitchen VITAMIN E PO Take 1 capsule by mouth daily.     Allergies:   Demerol [meperidine] and Sulfonamide derivatives   Social History   Socioeconomic History  . Marital status: Married    Spouse name: Lucas Willis  . Number of children: 2  . Years of education: Not on file  . Highest education level: Not on file  Occupational History  . Occupation: Art therapist  Tobacco Use  . Smoking status: Never Smoker  . Smokeless tobacco: Never Used  Substance and Sexual Activity  . Alcohol use: Yes    Alcohol/week: 0.0 standard drinks  Comment: rare  . Drug use: No  . Sexual activity: Yes  Other Topics Concern  . Not on file  Social History Narrative   2 kids, 23 and 54 year old.    Married   Social Determinants of Radio broadcast assistant Strain:   . Difficulty of Paying Living Expenses: Not on file  Food Insecurity:   . Worried About Charity fundraiser in the Last Year: Not on file  . Ran Out of Food in the Last Year: Not on file  Transportation Needs:   . Lack of Transportation (Medical): Not on file  . Lack of Transportation (Non-Medical): Not on file  Physical Activity:   . Days of Exercise per Week: Not on file  .  Minutes of Exercise per Session: Not on file  Stress:   . Feeling of Stress : Not on file  Social Connections:   . Frequency of Communication with Friends and Family: Not on file  . Frequency of Social Gatherings with Friends and Family: Not on file  . Attends Religious Services: Not on file  . Active Member of Clubs or Organizations: Not on file  . Attends Archivist Meetings: Not on file  . Marital Status: Not on file     Family History: The patient's family history includes Heart failure in his mother. ROS:   Please see the history of present illness.    All 14 point review of systems negative except as described per history of present illness  EKGs/Labs/Other Studies Reviewed:      Recent Labs: No results found for requested labs within last 8760 hours.  Recent Lipid Panel    Component Value Date/Time   CHOL 157 04/11/2018 0734   TRIG 249 (H) 04/11/2018 0734   TRIG 258 (HH) 06/08/2006 1002   HDL 35 (L) 04/11/2018 0734   CHOLHDL 4.5 04/11/2018 0734   VLDL 27.4 07/19/2017 0845   LDLCALC 89 04/11/2018 0734   LDLDIRECT 142.0 09/10/2009 1556    Physical Exam:    VS:  BP 108/74   Pulse 73   Ht 5\' 10"  (1.778 m)   Wt 217 lb (98.4 kg)   SpO2 97%   BMI 31.14 kg/m     Wt Readings from Last 3 Encounters:  07/23/19 217 lb (98.4 kg)  07/21/18 211 lb (95.7 kg)  07/19/17 204 lb (92.5 kg)     GEN:  Well nourished, well developed in no acute distress HEENT: Normal NECK: No JVD; No carotid bruits LYMPHATICS: No lymphadenopathy CARDIAC: RRR, no murmurs, no rubs, no gallops RESPIRATORY:  Clear to auscultation without rales, wheezing or rhonchi  ABDOMEN: Soft, non-tender, non-distended MUSCULOSKELETAL:  No edema; No deformity  SKIN: Warm and dry LOWER EXTREMITIES: no swelling NEUROLOGIC:  Alert and oriented x 3 PSYCHIATRIC:  Normal affect   ASSESSMENT:    1. Cardiomyopathy, unspecified type ([place])   2. Pure hypercholesterolemia   3. CAD, nonobstructive on  2017 cath   4. Fatigue, unspecified type    PLAN:    In order of problems listed above:  1. Cardiomyopathy.  I will schedule him to have an echocardiogram to assess left ventricle ejection fraction. 2. .  Dyslipidemia.  Will call primary care physician to get his fasting travel. 3. Coronary artery disease stable nonobstructive based on cardiac cath from 2017.  We will continue present management. 4. Fatigue and tiredness stable.  He is hoping for vacations which should make   Medication Adjustments/Labs and Tests Ordered: Current  medicines are reviewed at length with the patient today.  Concerns regarding medicines are outlined above.  Orders Placed This Encounter  Procedures  . Lipid panel  . Basic metabolic panel  . EKG 12-Lead  . ECHOCARDIOGRAM COMPLETE   Medication changes: No orders of the defined types were placed in this encounter.   Signed, Park Liter, MD, [facility] 07/23/2019 11:53 AM    [place]

## 2019-07-24 LAB — BASIC METABOLIC PANEL
BUN/Creatinine Ratio: 22 (ref 10–24)
BUN: 21 mg/dL (ref 8–27)
CO2: 25 mmol/L (ref 20–29)
Calcium: 9.4 mg/dL (ref 8.6–10.2)
Chloride: 103 mmol/L (ref 96–106)
Creatinine, Ser: 0.94 mg/dL (ref 0.76–1.27)
GFR calc Af Amer: 99 mL/min/{1.73_m2} (ref >59)
GFR calc non Af Amer: 86 mL/min/{1.73_m2} (ref >59)
Glucose: 101 mg/dL — ABNORMAL HIGH (ref 65–99)
Potassium: 4.3 mmol/L (ref 3.5–5.2)
Sodium: 143 mmol/L (ref 134–144)

## 2019-07-24 LAB — LIPID PANEL
Chol/HDL Ratio: 3.7 ratio (ref 0.0–5.0)
Cholesterol, Total: 155 mg/dL (ref 100–199)
HDL: 42 mg/dL (ref >39)
LDL Chol Calc (NIH): 82 mg/dL (ref 0–99)
Triglycerides: 180 mg/dL — ABNORMAL HIGH (ref 0–149)
VLDL Cholesterol Cal: 31 mg/dL (ref 5–40)

## 2019-07-27 ENCOUNTER — Telehealth: Payer: Self-pay

## 2019-07-27 DIAGNOSIS — E7800 Pure hypercholesterolemia, unspecified: Secondary | ICD-10-CM

## 2019-07-27 DIAGNOSIS — Z79899 Other long term (current) drug therapy: Secondary | ICD-10-CM

## 2019-07-27 MED ORDER — ATORVASTATIN CALCIUM 40 MG PO TABS: 40.0000 mg | ORAL_TABLET | Freq: Every day | ORAL | 3 refills | Status: DC

## 2019-07-27 NOTE — Telephone Encounter (Signed)
Pt verbalized understanding of his lab results.  

## 2019-07-27 NOTE — Telephone Encounter (Signed)
-----   Message from Park Liter, MD sent at 07/25/2019 10:03 PM EST ----- Labs are looking good, cholesterol still elevated.  Please double the dose of atorvastatin to 40 mg daily, fasting lipid profile to be repeated in 6 weeks

## 2019-08-02 ENCOUNTER — Other Ambulatory Visit: Payer: Self-pay | Admitting: Cardiology

## 2019-08-02 ENCOUNTER — Other Ambulatory Visit: Payer: Self-pay

## 2019-08-02 ENCOUNTER — Ambulatory Visit (HOSPITAL_BASED_OUTPATIENT_CLINIC_OR_DEPARTMENT_OTHER)
Admission: RE | Admit: 2019-08-02 | Discharge: 2019-08-02 | Disposition: A | Discharge status: Home / Self Care | Payer: BC Managed Care – PPO | Source: Ambulatory Visit | Attending: Cardiology | Admitting: Cardiology

## 2019-08-02 DIAGNOSIS — I429 Cardiomyopathy, unspecified: Secondary | ICD-10-CM | POA: Diagnosis not present

## 2019-08-02 NOTE — Progress Notes (Signed)
  Echocardiogram 2D Echocardiogram has been performed.  Darlina Sicilian M 08/02/2019, 9:01 AM

## 2019-08-06 ENCOUNTER — Other Ambulatory Visit: Payer: Self-pay

## 2019-08-06 MED ORDER — SPIRONOLACTONE 25 MG PO TABS: 25.0000 mg | ORAL_TABLET | Freq: Every day | ORAL | 3 refills | Status: DC

## 2019-08-08 ENCOUNTER — Telehealth: Payer: Self-pay | Admitting: Cardiology

## 2019-08-08 NOTE — Telephone Encounter (Signed)
I spoke to the patient who is awaiting results from his recent Echo.  I informed him that once Dr Agustin Cree reviews, we will reach out to him.  He verbalized understanding.

## 2019-08-08 NOTE — Telephone Encounter (Signed)
Follow Up:    Pt said he need somebody to please call and explain his Echo result. Pt;said he did not understand the results  on My-Chart.

## 2019-08-09 DIAGNOSIS — Z20822 Contact with and (suspected) exposure to covid-19: Secondary | ICD-10-CM | POA: Diagnosis not present

## 2019-08-09 DIAGNOSIS — Z03818 Encounter for observation for suspected exposure to other biological agents ruled out: Secondary | ICD-10-CM | POA: Diagnosis not present

## 2019-08-10 ENCOUNTER — Other Ambulatory Visit: Payer: Self-pay | Admitting: Cardiology

## 2019-08-14 NOTE — Telephone Encounter (Signed)
Attempted to call back. No answer and unable to leave voicemail.

## 2019-08-16 ENCOUNTER — Encounter: Payer: Self-pay | Admitting: *Deleted

## 2019-08-16 NOTE — Telephone Encounter (Signed)
Mailed pt a letter to call back for results after several attempts made to reach via phone.

## 2019-08-21 ENCOUNTER — Telehealth: Payer: Self-pay | Admitting: *Deleted

## 2019-08-21 NOTE — Telephone Encounter (Signed)
Pt's wife was seen in clinic today, upset that she thought noone had called him re: his echocardiogram results. Documentation showed someone tried 3x's and mailed a letter.  Wife then explained that they had travelled to [place] and had asked for them to call a "certain #' and they didn't. I contacted pt, per Dr. Agustin Cree, went over echo results and made a f/u appt for 09/21/2019 8:25 a.m

## 2019-08-27 ENCOUNTER — Telehealth: Payer: Self-pay | Admitting: Cardiology

## 2019-08-27 NOTE — Telephone Encounter (Signed)
New message:    Patient calling stating that some one called him back after he made a f/u. I did not see a note.

## 2019-08-28 ENCOUNTER — Encounter: Payer: Self-pay | Admitting: Family Medicine

## 2019-08-28 ENCOUNTER — Ambulatory Visit (INDEPENDENT_AMBULATORY_CARE_PROVIDER_SITE_OTHER): Payer: BC Managed Care – PPO | Admitting: Family Medicine

## 2019-08-28 ENCOUNTER — Other Ambulatory Visit: Payer: Self-pay

## 2019-08-28 VITALS — BP 114/68 | HR 72 | Temp 97.9°F | Ht 68.25 in | Wt 207.8 lb

## 2019-08-28 DIAGNOSIS — I429 Cardiomyopathy, unspecified: Secondary | ICD-10-CM | POA: Diagnosis not present

## 2019-08-28 DIAGNOSIS — E7800 Pure hypercholesterolemia, unspecified: Secondary | ICD-10-CM | POA: Diagnosis not present

## 2019-08-28 DIAGNOSIS — R252 Cramp and spasm: Secondary | ICD-10-CM

## 2019-08-28 DIAGNOSIS — Z125 Encounter for screening for malignant neoplasm of prostate: Secondary | ICD-10-CM | POA: Diagnosis not present

## 2019-08-28 DIAGNOSIS — Z0000 Encounter for general adult medical examination without abnormal findings: Secondary | ICD-10-CM | POA: Diagnosis not present

## 2019-08-28 DIAGNOSIS — I5020 Unspecified systolic (congestive) heart failure: Secondary | ICD-10-CM | POA: Diagnosis not present

## 2019-08-28 DIAGNOSIS — Z1159 Encounter for screening for other viral diseases: Secondary | ICD-10-CM

## 2019-08-28 DIAGNOSIS — Z23 Encounter for immunization: Secondary | ICD-10-CM | POA: Diagnosis not present

## 2019-08-28 LAB — HEPATIC FUNCTION PANEL
ALT: 41 U/L (ref 0–53)
AST: 30 U/L (ref 0–37)
Albumin: 4.5 g/dL (ref 3.5–5.2)
Alkaline Phosphatase: 87 U/L (ref 39–117)
Bilirubin, Direct: 0.2 mg/dL (ref 0.0–0.3)
Total Bilirubin: 0.7 mg/dL (ref 0.2–1.2)
Total Protein: 7 g/dL (ref 6.0–8.3)

## 2019-08-28 LAB — PSA: PSA: 0.52 ng/mL (ref 0.10–4.00)

## 2019-08-28 LAB — TSH: TSH: 2.17 u[IU]/mL (ref 0.35–4.50)

## 2019-08-28 NOTE — Assessment & Plan Note (Addendum)
Euvolemic Followed by cardiology Dr. Agustin Cree  On Coreg, entresto, spironolactone.

## 2019-08-28 NOTE — Progress Notes (Signed)
Chief Complaint  Patient presents with  . Annual Exam    History of Present Illness: HPI  The patient is here for annual wellness exam and preventative care.    CAD, cardiomyopathy and HFrEF: Followed by cardiology Last OV reviewed:  07/23/2019 repeat ECHO  EF 40-46%.Marland Kitchen down from 2020  On Coreg, Entresto, spironolactone. He is euvolemic.  Increased stress in last year. Work on long hours.   Elevated Cholesterol:  On atorvastatin... almost at goal LDL < 70.. now increased to  40 mg daily. Lab Results  Component Value Date   CHOL 155 07/23/2019   HDL 42 07/23/2019   LDLCALC 82 07/23/2019   LDLDIRECT 142.0 09/10/2009   TRIG 180 (H) 07/23/2019   CHOLHDL 3.7 07/23/2019  Using medications without problems: none Muscle aches: none Diet compliance:  moderate Exercise: less active.. no longer playing soccer... recently started  Back with gym Other complaints:  Wt Readings from Last 3 Encounters:  08/28/19 207 lb 12 oz (94.2 kg)  07/23/19 217 lb (98.4 kg)  07/21/18 211 lb (95.7 kg)   Has noted more cramps in feet and ankles in last year.   Some decrease in energy in last year.   This visit occurred during the SARS-CoV-2 public health emergency.  Safety protocols were in place, including screening questions prior to the visit, additional usage of staff PPE, and extensive cleaning of exam room while observing appropriate contact time as indicated for disinfecting solutions.   COVID 19 screen:  No recent travel or known exposure to COVID19 The patient denies respiratory symptoms of COVID 19 at this time. The importance of social distancing was discussed today.     Review of Systems  Constitutional: Negative for chills and fever.  HENT: Negative for congestion and ear pain.   Eyes: Negative for pain and redness.  Respiratory: Negative for cough and shortness of breath.   Cardiovascular: Negative for chest pain, palpitations and leg swelling.  Gastrointestinal: Negative for  abdominal pain, blood in stool, constipation, diarrhea, nausea and vomiting.  Genitourinary: Negative for dysuria.  Musculoskeletal: Negative for back pain, falls and myalgias.  Skin: Negative for rash.  Neurological: Negative for dizziness.  Psychiatric/Behavioral: Negative for depression. The patient is not nervous/anxious.       Past Medical History:  Diagnosis Date  . Cardiomyopathy ([place]) 09/04/2015  . Chronic systolic heart failure ([place]) 09/04/2015    reports that he has never smoked. He has never used smokeless tobacco. He reports current alcohol use. He reports that he does not use drugs.   Current Outpatient Medications:  .  Ascorbic Acid (VITAMIN C PO), Take 2,000 mg by mouth daily., Disp: , Rfl:  .  ASPIRIN LOW DOSE 81 MG EC tablet, TAKE 1 TABLET BY MOUTH EVERY DAY, Disp: 90 tablet, Rfl: 3 .  atorvastatin (LIPITOR) 40 MG tablet, Take 1 tablet (40 mg total) by mouth daily., Disp: 90 tablet, Rfl: 3 .  b complex vitamins tablet, Take 1 tablet by mouth daily., Disp: , Rfl:  .  carvedilol (COREG) 6.25 MG tablet, TAKE 1 TABLET BY MOUTH TWICE A DAY WITH MEALS, Disp: 180 tablet, Rfl: 3 .  cetirizine (ZYRTEC) 10 MG tablet, Take 10 mg by mouth daily., Disp: , Rfl:  .  Cholecalciferol (D3-1000) 1000 units tablet, Take 1,000 Units by mouth daily., Disp: , Rfl:  .  Chondroitin Sulfate-Vit C-Mn (CHONDROITIN SULFATE COMPLEX PO), Take 2 tablets by mouth daily., Disp: , Rfl:  .  Coenzyme Q10 (COQ10 PO), Take 1 capsule  by mouth daily., Disp: , Rfl:  .  MAGNESIUM PO, Take 1 tablet by mouth daily., Disp: , Rfl:  .  Multiple Vitamins-Minerals (CENTRUM SILVER ULTRA MENS PO), Take 1 tablet by mouth daily., Disp: , Rfl:  .  nitroGLYCERIN (NITROSTAT) 0.4 MG SL tablet, Place 0.4 mg under the tongue every 5 (five) minutes as needed for chest pain., Disp: , Rfl:  .  sacubitril-valsartan (ENTRESTO) 49-51 MG, Take 1 tablet by mouth 2 (two) times daily., Disp: 180 tablet, Rfl: 3 .  spironolactone (ALDACTONE) 25  MG tablet, Take 1 tablet (25 mg total) by mouth daily., Disp: 90 tablet, Rfl: 3 .  VITAMIN E PO, Take 1 capsule by mouth daily., Disp: , Rfl:    Observations/Objective: Blood pressure 114/68, pulse 72, temperature 97.9 F (36.6 C), temperature source Temporal, height 5' 8.25" (1.734 m), weight 207 lb 12 oz (94.2 kg), SpO2 97 %.   Physical Exam Constitutional:      General: He is not in acute distress.    Appearance: Normal appearance. He is well-developed. He is obese. He is not ill-appearing or toxic-appearing.  HENT:     Head: Normocephalic and atraumatic.     Right Ear: Hearing, tympanic membrane, ear canal and external ear normal.     Left Ear: Hearing, tympanic membrane, ear canal and external ear normal.     Nose: Nose normal.     Mouth/Throat:     Pharynx: Uvula midline.  Eyes:     General: Lids are normal. Lids are everted, no foreign bodies appreciated.     Conjunctiva/sclera: Conjunctivae normal.     Pupils: Pupils are equal, round, and reactive to light.  Neck:     Thyroid: No thyroid mass or thyromegaly.     Vascular: No carotid bruit.     Trachea: Trachea and phonation normal.  Cardiovascular:     Rate and Rhythm: Normal rate and regular rhythm.     Pulses: Normal pulses.     Heart sounds: S1 normal and S2 normal. No murmur. No gallop.   Pulmonary:     Breath sounds: Normal breath sounds. No wheezing, rhonchi or rales.  Abdominal:     General: Bowel sounds are normal.     Palpations: Abdomen is soft.     Tenderness: There is no abdominal tenderness. There is no guarding or rebound.     Hernia: No hernia is present.  Musculoskeletal:     Cervical back: Normal range of motion and neck supple.  Lymphadenopathy:     Cervical: No cervical adenopathy.  Skin:    General: Skin is warm and dry.     Findings: No rash.  Neurological:     Mental Status: He is alert.     Cranial Nerves: No cranial nerve deficit.     Sensory: No sensory deficit.     Gait: Gait normal.      Deep Tendon Reflexes: Reflexes are normal and symmetric.  Psychiatric:        Speech: Speech normal.        Behavior: Behavior normal.        Judgment: Judgment normal.      Assessment and Plan   The patient's preventative maintenance and recommended screening tests for an annual wellness exam were reviewed in full today. Brought up to date unless services declined.  Counselled on the importance of diet, exercise, and its role in overall health and mortality. The patient's FH and SH was reviewed, including their home life, tobacco status,  and drug and alcohol status.   Vaccines: uptodate except due for tdap..  Discussed COVID19 vaccine side effects and benefits. Strongly encouraged the patient to get the vaccine. Questions answered. .Prostate Cancer Screen: due  For re-eval Lab Results  Component Value Date   PSA 1.38 07/19/2017   PSA 0.83 05/14/2014   PSA 0.50 09/10/2009   Colon Cancer Screen: 12/26/2009 Dr. Fuller Plan, sessile polyp, repeat in 10 years      Smoking Status: nonsmoker ETOH/ drug use: none/none Hep C:   due HIV screen:  refused.    Heart failure with reduced ejection fraction ([place]) Euvolemic Followed by cardiology Dr. Agustin Cree  On Coreg, entresto, spironolactone.   Cardiomyopathy ([place]) Worse in last year per ECHo changes.  Pure hypercholesterolemia Now on higher dose  Of atorvastatin. Cardiology plans re-eval in 3 months.  Leg cramps INcrease water intake some ( carefully since HFrEF) , stretching, and return to exercise.  Eval for thyroid issues given mild fatigue as well.     Eliezer Lofts, MD

## 2019-08-28 NOTE — Assessment & Plan Note (Signed)
INcrease water intake some ( carefully since HFrEF) , stretching, and return to exercise.  Eval for thyroid issues given mild fatigue as well.

## 2019-08-28 NOTE — Addendum Note (Signed)
Addended by: Tammi Sou on: 08/28/2019 09:26 AM   Modules accepted: Orders

## 2019-08-28 NOTE — Patient Instructions (Signed)
Please stop at the lab to have labs drawn. Plan on colonoscopy in 12/2019.  Keep working on  exercise, weight loss, healthy eating habits!

## 2019-08-28 NOTE — Assessment & Plan Note (Signed)
Now on higher dose  Of atorvastatin. Cardiology plans re-eval in 3 months.

## 2019-08-28 NOTE — Assessment & Plan Note (Signed)
Worse in last year per ECHo changes.

## 2019-08-28 NOTE — Telephone Encounter (Signed)
Called and spoke to the patient's wife she thinks the patient got a different call and confused with [place] or saw a old call. She will check with him and let us know if he needs anything further if so they will call back. No further questions.

## 2019-08-29 ENCOUNTER — Other Ambulatory Visit: Payer: Self-pay | Admitting: Cardiology

## 2019-08-29 LAB — HEPATITIS C ANTIBODY
Hepatitis C Ab: NONREACTIVE
SIGNAL TO CUT-OFF: 0.08 (ref <1.00)

## 2019-09-21 ENCOUNTER — Other Ambulatory Visit: Payer: Self-pay

## 2019-09-21 ENCOUNTER — Encounter: Payer: Self-pay | Admitting: Cardiology

## 2019-09-21 ENCOUNTER — Ambulatory Visit: Payer: BC Managed Care – PPO | Admitting: Cardiology

## 2019-09-21 VITALS — BP 114/78 | HR 70 | Ht 68.25 in | Wt 205.0 lb

## 2019-09-21 DIAGNOSIS — I420 Dilated cardiomyopathy: Secondary | ICD-10-CM

## 2019-09-21 DIAGNOSIS — I2510 Atherosclerotic heart disease of native coronary artery without angina pectoris: Secondary | ICD-10-CM | POA: Diagnosis not present

## 2019-09-21 DIAGNOSIS — I5020 Unspecified systolic (congestive) heart failure: Secondary | ICD-10-CM

## 2019-09-21 DIAGNOSIS — E7800 Pure hypercholesterolemia, unspecified: Secondary | ICD-10-CM

## 2019-09-21 DIAGNOSIS — I509 Heart failure, unspecified: Secondary | ICD-10-CM | POA: Diagnosis not present

## 2019-09-21 NOTE — Progress Notes (Signed)
Cardiology Office Note:    Date:  09/21/2019   ID:  Lucas Willis, DOB [birth year], MRN [id number]  PCP:  Lucas Sanders, MD  Cardiologist:  Lucas Campus, MD    Referring MD: Lucas Sanders, MD   Chief Complaint  Patient presents with  . Follow-up  Doing fine  History of Present Illness:    Lucas Willis is a 64 y.o. male with history of cardiomyopathy which is nonischemic.  In 2017 he had severely diminished left ventricle ejection fraction, cardiac catheterization done at that time showing no significant coronary artery disease.  He is on appropriate guideline directed medical therapy doing overall well.  Recently he lost about 15 pounds by exercising more and eating less.  Goes to gym on a regular basis doing well.  His echocardiogram has been repeated showed ejection fraction 40 to 45%.  He is very concerned about it.  We talked about options for this situation today.  We will do EKG and if EKG is fine I will double the dose of carvedilol.  Past Medical History:  Diagnosis Date  . Cardiomyopathy ([place]) 09/04/2015  . Chronic systolic heart failure ([place]) 09/04/2015    Past Surgical History:  Procedure Laterality Date  . CARDIAC CATHETERIZATION N/A 09/11/2015   Procedure: Right/Left Heart Cath and Coronary Angiography;  Surgeon: Peter M Martinique, MD;  Location: [facility];  Service: Cardiovascular;  Laterality: N/A;  . CYST REMOVAL LEG Left 1970   back of L knee  . HERNIA REPAIR Left 1979   inguinal  . PLANTAR'S WART EXCISION  1972  . SHOULDER SURGERY Right 2008   Dislocation Repair    Current Medications: Current Meds  Medication Sig  . Ascorbic Acid (VITAMIN C PO) Take 2,000 mg by mouth daily.  . ASPIRIN LOW DOSE 81 MG EC tablet TAKE 1 TABLET BY MOUTH EVERY DAY  . atorvastatin (LIPITOR) 40 MG tablet Take 1 tablet (40 mg total) by mouth daily.  Marland Kitchen b complex vitamins tablet Take 1 tablet by mouth daily.  . carvedilol (COREG) 6.25 MG tablet TAKE 1 TABLET BY MOUTH  TWICE A DAY WITH MEALS  . cetirizine (ZYRTEC) 10 MG tablet Take 10 mg by mouth daily.  . Cholecalciferol (D3-1000) 1000 units tablet Take 1,000 Units by mouth daily.  . Chondroitin Sulfate-Vit C-Mn (CHONDROITIN SULFATE COMPLEX PO) Take 2 tablets by mouth daily.  . Coenzyme Q10 (COQ10 PO) Take 1 capsule by mouth daily.  Marland Kitchen ENTRESTO 49-51 MG TAKE 1 TABLET BY MOUTH TWICE A DAY  . MAGNESIUM PO Take 1 tablet by mouth daily.  . Multiple Vitamins-Minerals (CENTRUM SILVER ULTRA MENS PO) Take 1 tablet by mouth daily.  . nitroGLYCERIN (NITROSTAT) 0.4 MG SL tablet Place 0.4 mg under the tongue every 5 (five) minutes as needed for chest pain.  Marland Kitchen spironolactone (ALDACTONE) 25 MG tablet Take 1 tablet (25 mg total) by mouth daily.  Marland Kitchen VITAMIN E PO Take 1 capsule by mouth daily.     Allergies:   Demerol [meperidine] and Sulfonamide derivatives   Social History   Socioeconomic History  . Marital status: Married    Spouse name: Lucas Willis  . Number of children: 2  . Years of education: Not on file  . Highest education level: Not on file  Occupational History  . Occupation: Art therapist  Tobacco Use  . Smoking status: Never Smoker  . Smokeless tobacco: Never Used  Substance and Sexual Activity  . Alcohol use: Yes  Alcohol/week: 0.0 standard drinks    Comment: rare  . Drug use: No  . Sexual activity: Yes  Other Topics Concern  . Not on file  Social History Narrative   2 kids, 6 and 47 year old.    Married   Social Determinants of Radio broadcast assistant Strain:   . Difficulty of Paying Living Expenses:   Food Insecurity:   . Worried About Charity fundraiser in the Last Year:   . Arboriculturist in the Last Year:   Transportation Needs:   . Film/video editor (Medical):   Marland Kitchen Lack of Transportation (Non-Medical):   Physical Activity:   . Days of Exercise per Week:   . Minutes of Exercise per Session:   Stress:   . Feeling of Stress :   Social Connections:   .  Frequency of Communication with Friends and Family:   . Frequency of Social Gatherings with Friends and Family:   . Attends Religious Services:   . Active Member of Clubs or Organizations:   . Attends Archivist Meetings:   Marland Kitchen Marital Status:      Family History: The patient's family history includes Heart failure in his mother. ROS:   Please see the history of present illness.    All 14 point review of systems negative except as described per history of present illness  EKGs/Labs/Other Studies Reviewed:      Recent Labs: 07/23/2019: BUN 21; Creatinine, Ser 0.94; Potassium 4.3; Sodium 143 08/28/2019: ALT 41; TSH 2.17  Recent Lipid Panel    Component Value Date/Time   CHOL 155 07/23/2019 0902   TRIG 180 (H) 07/23/2019 0902   TRIG 258 (HH) 06/08/2006 1002   HDL 42 07/23/2019 0902   CHOLHDL 3.7 07/23/2019 0902   CHOLHDL 4.5 04/11/2018 0734   VLDL 27.4 07/19/2017 0845   LDLCALC 82 07/23/2019 0902   LDLCALC 89 04/11/2018 0734   LDLDIRECT 142.0 09/10/2009 1556    Physical Exam:    VS:  BP 114/78   Pulse 70   Ht 5' 8.25" (1.734 m)   Wt 205 lb (93 kg)   SpO2 98%   BMI 30.94 kg/m     Wt Readings from Last 3 Encounters:  09/21/19 205 lb (93 kg)  08/28/19 207 lb 12 oz (94.2 kg)  07/23/19 217 lb (98.4 kg)     GEN:  Well nourished, well developed in no acute distress HEENT: Normal NECK: No JVD; No carotid bruits LYMPHATICS: No lymphadenopathy CARDIAC: RRR, no murmurs, no rubs, no gallops RESPIRATORY:  Clear to auscultation without rales, wheezing or rhonchi  ABDOMEN: Soft, non-tender, non-distended MUSCULOSKELETAL:  No edema; No deformity  SKIN: Warm and dry LOWER EXTREMITIES: no swelling NEUROLOGIC:  Alert and oriented x 3 PSYCHIATRIC:  Normal affect   ASSESSMENT:    1. Dilated cardiomyopathy ([place])   2. Heart failure with reduced ejection fraction ([facility])   3. Pure hypercholesterolemia   4. CAD, nonobstructive on 2017 cath    PLAN:    In order of  problems listed above:  1. Dilated cardiomyopathy ejection fraction 4045%, nonischemic, cardiac cath 2017 nonobstructive.  Continue present management. 2. Congestive heart failure, compensated, New York Heart Association class I. 3. Dyslipidemia: I do have his fasting lipid profile and K PN from 2019 with LDL of 89.  We will make arrangements for fasting lipid profile again. 4. Coronary artery disease, nonobstructive based on cardiac cath from 2017.  I will schedule him to have  another echocardiogram in about 5 months and then I see him a week later.   Medication Adjustments/Labs and Tests Ordered: Current medicines are reviewed at length with the patient today.  Concerns regarding medicines are outlined above.  No orders of the defined types were placed in this encounter.  Medication changes: No orders of the defined types were placed in this encounter.   Signed, Park Liter, MD, [facility] 09/21/2019 9:17 AM    [place]

## 2019-09-21 NOTE — Patient Instructions (Signed)
Medication Instructions:  Your physician recommends that you continue on your current medications as directed. Please refer to the Current Medication list given to you today.   Labwork: -None  Testing/Procedures: Office will call to schedule one week prior to Dr. Fraser Din appt.   Your physician has requested that you have an echocardiogram. Echocardiography is a painless test that uses sound waves to create images of your heart. It provides your doctor with information about the size and shape of your heart and how well your heart's chambers and valves are working. This procedure takes approximately one hour. There are no restrictions for this procedure.   Echocardiogram An echocardiogram is a procedure that uses painless sound waves (ultrasound) to produce an image of the heart. Images from an echocardiogram can provide important information about:  Signs of coronary artery disease (CAD).  Aneurysm detection. An aneurysm is a weak or damaged part of an artery wall that bulges out from the normal force of blood pumping through the body.  Heart size and shape. Changes in the size or shape of the heart can be associated with certain conditions, including heart failure, aneurysm, and CAD.  Heart muscle function.  Heart valve function.  Signs of a past heart attack.  Fluid buildup around the heart.  Thickening of the heart muscle.  A tumor or infectious growth around the heart valves. Tell a health care provider about:  Any allergies you have.  All medicines you are taking, including vitamins, herbs, eye drops, creams, and over-the-counter medicines.  Any blood disorders you have.  Any surgeries you have had.  Any medical conditions you have.  Whether you are pregnant or may be pregnant. What are the risks? Generally, this is a safe procedure. However, problems may occur, including:  Allergic reaction to dye (contrast) that may be used during the procedure. What happens  before the procedure? No specific preparation is needed. You may eat and drink normally. What happens during the procedure?   An IV tube may be inserted into one of your veins.  You may receive contrast through this tube. A contrast is an injection that improves the quality of the pictures from your heart.  A gel will be applied to your chest.  A wand-like tool (transducer) will be moved over your chest. The gel will help to transmit the sound waves from the transducer.  The sound waves will harmlessly bounce off of your heart to allow the heart images to be captured in real-time motion. The images will be recorded on a computer. The procedure may vary among health care providers and hospitals. What happens after the procedure?  You may return to your normal, everyday life, including diet, activities, and medicines, unless your health care provider tells you not to do that. Summary  An echocardiogram is a procedure that uses painless sound waves (ultrasound) to produce an image of the heart.  Images from an echocardiogram can provide important information about the size and shape of your heart, heart muscle function, heart valve function, and fluid buildup around your heart.  You do not need to do anything to prepare before this procedure. You may eat and drink normally.  After the echocardiogram is completed, you may return to your normal, everyday life, unless your health care provider tells you not to do that. This information is not intended to replace advice given to you by your health care provider. Make sure you discuss any questions you have with your health care provider. Document Revised:  09/07/2018 Document Reviewed: 06/19/2016 Elsevier Patient Education  [place]: Your physician wants you to follow-up in: 5 months with echo 1 week prior to appointment.  You will receive a reminder letter in the mail two months in advance. If you don't receive a  letter, please call our office to schedule the follow-up appointment.   Any Other Special Instructions Will Be Listed Below (If Applicable).     If you need a refill on your cardiac medications before your next appointment, please call your pharmacy.

## 2019-10-31 ENCOUNTER — Emergency Department (HOSPITAL_COMMUNITY): Payer: BC Managed Care – PPO

## 2019-10-31 ENCOUNTER — Emergency Department (HOSPITAL_COMMUNITY)
Admission: EM | Admit: 2019-10-31 | Discharge: 2019-10-31 | Disposition: A | Discharge status: Home / Self Care | Payer: BC Managed Care – PPO | Attending: Emergency Medicine | Admitting: Emergency Medicine

## 2019-10-31 ENCOUNTER — Encounter (HOSPITAL_COMMUNITY): Payer: Self-pay | Admitting: Emergency Medicine

## 2019-10-31 ENCOUNTER — Other Ambulatory Visit: Payer: Self-pay

## 2019-10-31 DIAGNOSIS — R531 Weakness: Secondary | ICD-10-CM | POA: Diagnosis not present

## 2019-10-31 DIAGNOSIS — Z7982 Long term (current) use of aspirin: Secondary | ICD-10-CM | POA: Diagnosis not present

## 2019-10-31 DIAGNOSIS — I959 Hypotension, unspecified: Secondary | ICD-10-CM | POA: Diagnosis not present

## 2019-10-31 DIAGNOSIS — Z79899 Other long term (current) drug therapy: Secondary | ICD-10-CM | POA: Insufficient documentation

## 2019-10-31 DIAGNOSIS — I5022 Chronic systolic (congestive) heart failure: Secondary | ICD-10-CM | POA: Diagnosis not present

## 2019-10-31 DIAGNOSIS — R42 Dizziness and giddiness: Secondary | ICD-10-CM | POA: Diagnosis not present

## 2019-10-31 DIAGNOSIS — T50905A Adverse effect of unspecified drugs, medicaments and biological substances, initial encounter: Secondary | ICD-10-CM | POA: Insufficient documentation

## 2019-10-31 DIAGNOSIS — I951 Orthostatic hypotension: Secondary | ICD-10-CM | POA: Insufficient documentation

## 2019-10-31 LAB — URINALYSIS, ROUTINE W REFLEX MICROSCOPIC
Bacteria, UA: NONE SEEN
Bilirubin Urine: NEGATIVE
Glucose, UA: NEGATIVE mg/dL
Hgb urine dipstick: NEGATIVE
Ketones, ur: 5 mg/dL — AB
Leukocytes,Ua: NEGATIVE
Nitrite: NEGATIVE
Protein, ur: 30 mg/dL — AB
Specific Gravity, Urine: 1.029 (ref 1.005–1.030)
pH: 5 (ref 5.0–8.0)

## 2019-10-31 LAB — CBC WITH DIFFERENTIAL/PLATELET
Abs Immature Granulocytes: 0.02 10*3/uL (ref 0.00–0.07)
Basophils Absolute: 0 10*3/uL (ref 0.0–0.1)
Basophils Relative: 0 %
Eosinophils Absolute: 0.1 10*3/uL (ref 0.0–0.5)
Eosinophils Relative: 1 %
HCT: 36.2 % — ABNORMAL LOW (ref 39.0–52.0)
Hemoglobin: 12.6 g/dL — ABNORMAL LOW (ref 13.0–17.0)
Immature Granulocytes: 0 %
Lymphocytes Relative: 30 %
Lymphs Abs: 2.6 10*3/uL (ref 0.7–4.0)
MCH: 33.9 pg (ref 26.0–34.0)
MCHC: 34.8 g/dL (ref 30.0–36.0)
MCV: 97.3 fL (ref 80.0–100.0)
Monocytes Absolute: 0.9 10*3/uL (ref 0.1–1.0)
Monocytes Relative: 11 %
Neutro Abs: 4.9 10*3/uL (ref 1.7–7.7)
Neutrophils Relative %: 58 %
Platelets: 198 10*3/uL (ref 150–400)
RBC: 3.72 MIL/uL — ABNORMAL LOW (ref 4.22–5.81)
RDW: 12.4 % (ref 11.5–15.5)
WBC: 8.5 10*3/uL (ref 4.0–10.5)
nRBC: 0 % (ref 0.0–0.2)

## 2019-10-31 LAB — I-STAT CHEM 8, ED
BUN: 28 mg/dL — ABNORMAL HIGH (ref 8–23)
Calcium, Ion: 1.2 mmol/L (ref 1.15–1.40)
Chloride: 105 mmol/L (ref 98–111)
Creatinine, Ser: 1.1 mg/dL (ref 0.61–1.24)
Glucose, Bld: 76 mg/dL (ref 70–99)
HCT: 33 % — ABNORMAL LOW (ref 39.0–52.0)
Hemoglobin: 11.2 g/dL — ABNORMAL LOW (ref 13.0–17.0)
Potassium: 3.3 mmol/L — ABNORMAL LOW (ref 3.5–5.1)
Sodium: 142 mmol/L (ref 135–145)
TCO2: 25 mmol/L (ref 22–32)

## 2019-10-31 LAB — TROPONIN I (HIGH SENSITIVITY)
Troponin I (High Sensitivity): 9 ng/L (ref <18)
Troponin I (High Sensitivity): 7 ng/L (ref <18)

## 2019-10-31 NOTE — ED Triage Notes (Signed)
Pt arrived via EMS from home. Pt reports waking up at 1am and feeling dizzy. When EMS arrived pt had a BP of 70 palpated. Pt received 122ml of NS and BP went up to 106/66

## 2019-10-31 NOTE — ED Provider Notes (Signed)
[facility] DEPT Provider Note   CSN: [id number] Arrival date & time: 10/31/19  0208     History Chief Complaint  Patient presents with  . Dizziness    Lucas Willis is a 64 y.o. male.  The history is provided by the patient.  Dizziness Quality:  Lightheadedness Severity:  Moderate Onset quality:  Gradual Timing:  Constant Progression:  Resolved Chronicity:  New Context: standing up   Relieved by:  Nothing Worsened by:  Nothing Ineffective treatments:  Fluids (by ems) Associated symptoms: no blood in stool, no chest pain, no diarrhea, no headaches, no hearing loss, no nausea, no palpitations, no shortness of breath, no syncope, no tinnitus, no vision changes, no vomiting and no weakness   Risk factors: multiple medications   Risk factors: no hx of stroke and no hx of vertigo   Risk factors comment:  CHF Patient felt lightheaded upon standing and was really worried so he took NTG.  Then he felt worse.  No CP, no SOB, no n/v/d.  No weakness, no numbness no changes in vision or speech.  He never had any chest, neck nor back pain.      Past Medical History:  Diagnosis Date  . Cardiomyopathy ([place]) 09/04/2015  . Chronic systolic heart failure ([place]) 09/04/2015    Patient Active Problem List   Diagnosis Date Noted  . CAD, nonobstructive on 2017 cath 07/19/2017  . Fatigue 07/19/2017  . Cardiomyopathy ([place]) 09/04/2015  . Heart failure with reduced ejection fraction ([place]) 09/04/2015  . Pure hypercholesterolemia 06/04/2014  . Leg cramps 05/14/2014    Past Surgical History:  Procedure Laterality Date  . CARDIAC CATHETERIZATION N/A 09/11/2015   Procedure: Right/Left Heart Cath and Coronary Angiography;  Surgeon: Peter M Martinique, MD;  Location: [facility];  Service: Cardiovascular;  Laterality: N/A;  . CYST REMOVAL LEG Left 1970   back of L knee  . HERNIA REPAIR Left 1979   inguinal  . PLANTAR'S WART EXCISION  1972  . SHOULDER SURGERY  Right 2008   Dislocation Repair       Family History  Problem Relation Age of Onset  . Heart failure Mother     Social History   Tobacco Use  . Smoking status: Never Smoker  . Smokeless tobacco: Never Used  Substance Use Topics  . Alcohol use: Yes    Alcohol/week: 0.0 standard drinks    Comment: rare  . Drug use: No    Home Medications Prior to Admission medications   Medication Sig Start Date End Date Taking? Authorizing Provider  Ascorbic Acid (VITAMIN C PO) Take 2,000 mg by mouth daily.    [provider]  ASPIRIN LOW DOSE 81 MG EC tablet TAKE 1 TABLET BY MOUTH EVERY DAY 08/02/19   Park Liter, MD  atorvastatin (LIPITOR) 40 MG tablet Take 1 tablet (40 mg total) by mouth daily. 07/27/19   Park Liter, MD  b complex vitamins tablet Take 1 tablet by mouth daily.    [provider]  carvedilol (COREG) 6.25 MG tablet TAKE 1 TABLET BY MOUTH TWICE A DAY WITH MEALS 08/02/19   Park Liter, MD  cetirizine (ZYRTEC) 10 MG tablet Take 10 mg by mouth daily.    [provider]  Cholecalciferol (D3-1000) 1000 units tablet Take 1,000 Units by mouth daily.    [provider]  Chondroitin Sulfate-Vit C-Mn (CHONDROITIN SULFATE COMPLEX PO) Take 2 tablets by mouth daily.    [provider]  Coenzyme Q10 (COQ10 PO) Take 1 capsule by mouth daily.    [provider]  ENTRESTO 49-51 MG TAKE 1 TABLET BY MOUTH TWICE A DAY 08/29/19   Park Liter, MD  MAGNESIUM PO Take 1 tablet by mouth daily.    [provider]  Multiple Vitamins-Minerals (CENTRUM SILVER ULTRA MENS PO) Take 1 tablet by mouth daily.    [provider]  nitroGLYCERIN (NITROSTAT) 0.4 MG SL tablet Place 0.4 mg under the tongue every 5 (five) minutes as needed for chest pain.    [provider]  spironolactone (ALDACTONE) 25 MG tablet Take 1 tablet (25 mg total) by mouth daily. 08/06/19   Park Liter, MD  VITAMIN E PO Take 1  capsule by mouth daily.    [provider]    Allergies    Demerol [meperidine] and Sulfonamide derivatives  Review of Systems   Review of Systems  Constitutional: Negative for fever.  HENT: Negative for hearing loss and tinnitus.   Eyes: Negative for photophobia and visual disturbance.  Respiratory: Negative for shortness of breath.   Cardiovascular: Negative for chest pain, palpitations, leg swelling and syncope.  Gastrointestinal: Negative for abdominal pain, blood in stool, diarrhea, nausea and vomiting.  Genitourinary: Negative for difficulty urinating and flank pain.  Musculoskeletal: Negative for arthralgias.  Skin: Negative for rash.  Neurological: Positive for light-headedness. Negative for dizziness, tremors, syncope, facial asymmetry, speech difficulty, weakness, numbness and headaches.  Psychiatric/Behavioral: Negative for agitation.  All other systems reviewed and are negative.   Physical Exam Updated Vital Signs BP 108/70   Pulse 63   Temp 98.9 F (37.2 C) (Oral)   Resp (!) 22   Ht 5' 9.5" (1.765 m)   Wt 87.5 kg   SpO2 98%   BMI 28.09 kg/m   Physical Exam Vitals and nursing note reviewed.  Constitutional:      General: He is not in acute distress.    Appearance: Normal appearance. He is not diaphoretic.  HENT:     Head: Normocephalic and atraumatic.     Nose: Nose normal.     Mouth/Throat:     Mouth: Mucous membranes are moist.     Pharynx: Oropharynx is clear.  Eyes:     Extraocular Movements: Extraocular movements intact.     Pupils: Pupils are equal, round, and reactive to light.  Cardiovascular:     Rate and Rhythm: Normal rate and regular rhythm.     Pulses: Normal pulses.     Heart sounds: Normal heart sounds.  Pulmonary:     Effort: Pulmonary effort is normal.     Breath sounds: Normal breath sounds.  Abdominal:     General: Abdomen is flat. Bowel sounds are normal.     Tenderness: There is no abdominal tenderness.    Musculoskeletal:        General: Normal range of motion.     Cervical back: Normal range of motion and neck supple.  Skin:    General: Skin is warm and dry.     Capillary Refill: Capillary refill takes less than 2 seconds.  Neurological:     General: No focal deficit present.     Mental Status: He is alert and oriented to person, place, and time.     Cranial Nerves: No cranial nerve deficit.     Deep Tendon Reflexes: Reflexes normal.  Psychiatric:        Mood and Affect: Mood is anxious.     ED Results /  Procedures / Treatments   Labs (all labs ordered are listed, but only abnormal results are displayed) Results for orders placed or performed during the hospital encounter of 10/31/19  CBC with Differential/Platelet  Result Value Ref Range   WBC 8.5 4.0 - 10.5 K/uL   RBC 3.72 (L) 4.22 - 5.81 MIL/uL   Hemoglobin 12.6 (L) 13.0 - 17.0 g/dL   HCT 36.2 (L) 39.0 - 52.0 %   MCV 97.3 80.0 - 100.0 fL   MCH 33.9 26.0 - 34.0 pg   MCHC 34.8 30.0 - 36.0 g/dL   RDW 12.4 11.5 - 15.5 %   Platelets 198 150 - 400 K/uL   nRBC 0.0 0.0 - 0.2 %   Neutrophils Relative % 58 %   Neutro Abs 4.9 1.7 - 7.7 K/uL   Lymphocytes Relative 30 %   Lymphs Abs 2.6 0.7 - 4.0 K/uL   Monocytes Relative 11 %   Monocytes Absolute 0.9 0.1 - 1.0 K/uL   Eosinophils Relative 1 %   Eosinophils Absolute 0.1 0.0 - 0.5 K/uL   Basophils Relative 0 %   Basophils Absolute 0.0 0.0 - 0.1 K/uL   Immature Granulocytes 0 %   Abs Immature Granulocytes 0.02 0.00 - 0.07 K/uL  Urinalysis, Routine w reflex microscopic  Result Value Ref Range   Color, Urine AMBER (A) YELLOW   APPearance CLEAR CLEAR   Specific Gravity, Urine 1.029 1.005 - 1.030   pH 5.0 5.0 - 8.0   Glucose, UA NEGATIVE NEGATIVE mg/dL   Hgb urine dipstick NEGATIVE NEGATIVE   Bilirubin Urine NEGATIVE NEGATIVE   Ketones, ur 5 (A) NEGATIVE mg/dL   Protein, ur 30 (A) NEGATIVE mg/dL   Nitrite NEGATIVE NEGATIVE   Leukocytes,Ua NEGATIVE NEGATIVE   RBC / HPF 0-5 0  - 5 RBC/hpf   WBC, UA 0-5 0 - 5 WBC/hpf   Bacteria, UA NONE SEEN NONE SEEN   Mucus PRESENT    Hyaline Casts, UA PRESENT   I-stat chem 8, ED (not at [facility] or [facility])  Result Value Ref Range   Sodium 142 135 - 145 mmol/L   Potassium 3.3 (L) 3.5 - 5.1 mmol/L   Chloride 105 98 - 111 mmol/L   BUN 28 (H) 8 - 23 mg/dL   Creatinine, Ser 1.10 0.61 - 1.24 mg/dL   Glucose, Bld 76 70 - 99 mg/dL   Calcium, Ion 1.20 1.15 - 1.40 mmol/L   TCO2 25 22 - 32 mmol/L   Hemoglobin 11.2 (L) 13.0 - 17.0 g/dL   HCT 33.0 (L) 39.0 - 52.0 %  Troponin I (High Sensitivity)  Result Value Ref Range   Troponin I (High Sensitivity) 9 <18 ng/L   CT Head Wo Contrast  Result Date: 10/31/2019 CLINICAL DATA:  Dizziness and hypotension EXAM: CT HEAD WITHOUT CONTRAST TECHNIQUE: Contiguous axial images were obtained from the base of the skull through the vertex without intravenous contrast. COMPARISON:  10/08/2008 FINDINGS: Brain: No evidence of acute infarction, hemorrhage, hydrocephalus, extra-axial collection or mass effect. Vascular: No hyperdense vessel or unexpected calcification. Skull: Normal. Negative for fracture or focal lesion. Sinuses/Orbits: No acute finding. IMPRESSION: Negative head CT. Electronically Signed   By: Monte Fantasia M.D.   On: 10/31/2019 04:02   DG Chest Portable 1 View  Result Date: 10/31/2019 CLINICAL DATA:  Lightheadedness EXAM: PORTABLE CHEST 1 VIEW COMPARISON:  04/02/2018 FINDINGS: The heart size and mediastinal contours are within normal limits. Both lungs are clear. The visualized skeletal structures are unremarkable. IMPRESSION: No active disease.  Electronically Signed   By: Inez Catalina M.D.   On: 10/31/2019 03:10    EKG EKG Interpretation  Date/Time:  Wednesday October 31 2019 02:36:24 EDT Ventricular Rate:  70 PR Interval:    QRS Duration: 117 QT Interval:  433 QTC Calculation: 468 R Axis:   -43 Text Interpretation: Sinus rhythm Prolonged PR interval septal  infarct, old Confirmed by  Randal Buba, Mallery Harshman ([id number]) on 10/31/2019 3:45:52 AM   Radiology CT Head Wo Contrast  Result Date: 10/31/2019 CLINICAL DATA:  Dizziness and hypotension EXAM: CT HEAD WITHOUT CONTRAST TECHNIQUE: Contiguous axial images were obtained from the base of the skull through the vertex without intravenous contrast. COMPARISON:  10/08/2008 FINDINGS: Brain: No evidence of acute infarction, hemorrhage, hydrocephalus, extra-axial collection or mass effect. Vascular: No hyperdense vessel or unexpected calcification. Skull: Normal. Negative for fracture or focal lesion. Sinuses/Orbits: No acute finding. IMPRESSION: Negative head CT. Electronically Signed   By: Monte Fantasia M.D.   On: 10/31/2019 04:02   DG Chest Portable 1 View  Result Date: 10/31/2019 CLINICAL DATA:  Lightheadedness EXAM: PORTABLE CHEST 1 VIEW COMPARISON:  04/02/2018 FINDINGS: The heart size and mediastinal contours are within normal limits. Both lungs are clear. The visualized skeletal structures are unremarkable. IMPRESSION: No active disease. Electronically Signed   By: Inez Catalina M.D.   On: 10/31/2019 03:10    Procedures Procedures (including critical care time)  Medications Ordered in ED Medications - No data to display  ED Course  I have reviewed the triage vital signs and the nursing notes.  Pertinent labs & imaging results that were available during my care of the patient were reviewed by me and considered in my medical decision making (see chart for details).    Feeling better post IVF.  Ruled out for MI in the ED.  No signs of CVA on exam or CT.  Patient made own problem worse by taking nitroglycerin and becoming overtly hypotensive as he was anxious that something was wrong.  There are no signs of infection on exam, labs or imaging.  Patient is advised to follow up with his PMD and hydrate well at home.   Final Clinical Impression(s) / ED Diagnoses Return for intractable cough, coughing up blood,fevers >100.4 unrelieved by  medication, shortness of breath, intractable vomiting, chest pain, shortness of breath, weakness,numbness, changes in speech, facial asymmetry,abdominal pain, passing out,Inability to tolerate liquids or food, cough, altered mental status or any concerns. No signs of systemic illness or infection. The patient is nontoxic-appearing on exam and vital signs are within normal limits.   I have reviewed the triage vital signs and the nursing notes. Pertinent labs &imaging results that were available during my care of the patient were reviewed by me and considered in my medical decision making (see chart for details).After history, exam, and medical workup I feel the patient has beenappropriately medically screened and is safe for discharge home. Pertinent diagnoses were discussed with the patient. Patient was given return precautions.   Anihya Tuma, MD 10/31/19 [phone]

## 2020-03-19 ENCOUNTER — Other Ambulatory Visit: Payer: Self-pay

## 2020-03-19 ENCOUNTER — Ambulatory Visit (HOSPITAL_BASED_OUTPATIENT_CLINIC_OR_DEPARTMENT_OTHER)
Admission: RE | Admit: 2020-03-19 | Discharge: 2020-03-19 | Disposition: A | Discharge status: Home / Self Care | Payer: BC Managed Care – PPO | Source: Ambulatory Visit | Attending: Cardiology | Admitting: Cardiology

## 2020-03-19 DIAGNOSIS — I5020 Unspecified systolic (congestive) heart failure: Secondary | ICD-10-CM | POA: Insufficient documentation

## 2020-03-19 DIAGNOSIS — E7800 Pure hypercholesterolemia, unspecified: Secondary | ICD-10-CM | POA: Insufficient documentation

## 2020-03-19 DIAGNOSIS — I2510 Atherosclerotic heart disease of native coronary artery without angina pectoris: Secondary | ICD-10-CM | POA: Insufficient documentation

## 2020-03-19 DIAGNOSIS — I420 Dilated cardiomyopathy: Secondary | ICD-10-CM | POA: Insufficient documentation

## 2020-03-19 LAB — ECHOCARDIOGRAM COMPLETE
Area-P 1/2: 5.02 cm2
S' Lateral: 4.19 cm

## 2020-03-21 ENCOUNTER — Telehealth: Payer: Self-pay | Admitting: Cardiology

## 2020-03-21 NOTE — Telephone Encounter (Signed)
Patient returning [facility] call from yesterday in regards to echo results.

## 2020-03-21 NOTE — Telephone Encounter (Signed)
Spoke with patient regarding results and recommendation.  Patient verbalizes understanding and is agreeable to plan of care. Advised patient to call back with any issues or concerns.  

## 2020-04-23 DIAGNOSIS — M11262 Other chondrocalcinosis, left knee: Secondary | ICD-10-CM | POA: Diagnosis not present

## 2020-04-23 DIAGNOSIS — M1712 Unilateral primary osteoarthritis, left knee: Secondary | ICD-10-CM | POA: Diagnosis not present

## 2020-04-30 ENCOUNTER — Other Ambulatory Visit: Payer: Self-pay | Admitting: Orthopedic Surgery

## 2020-04-30 DIAGNOSIS — M25562 Pain in left knee: Secondary | ICD-10-CM

## 2020-05-02 ENCOUNTER — Other Ambulatory Visit: Payer: Self-pay

## 2020-05-02 ENCOUNTER — Ambulatory Visit
Admission: RE | Admit: 2020-05-02 | Discharge: 2020-05-02 | Disposition: A | Discharge status: Home / Self Care | Payer: BC Managed Care – PPO | Source: Ambulatory Visit | Attending: Orthopedic Surgery | Admitting: Orthopedic Surgery

## 2020-05-02 DIAGNOSIS — M25562 Pain in left knee: Secondary | ICD-10-CM

## 2020-05-02 DIAGNOSIS — M25561 Pain in right knee: Secondary | ICD-10-CM | POA: Diagnosis not present

## 2020-05-08 ENCOUNTER — Telehealth: Payer: Self-pay

## 2020-05-08 DIAGNOSIS — S83242A Other tear of medial meniscus, current injury, left knee, initial encounter: Secondary | ICD-10-CM | POA: Diagnosis not present

## 2020-05-08 NOTE — Telephone Encounter (Signed)
   [facility] Pre-operative Risk Assessment    Request for surgical clearance:  1. What type of surgery is being performed? Left Knee Scope   2. When is this surgery scheduled? TBD   3. What type of clearance is required (medical clearance vs. Pharmacy clearance to hold med vs. Both)? Medical Clearance  4. Are there any medications that need to be held prior to surgery and how long?Not specified however the patient is on ASA  5. Practice name and name of physician performing surgery? Dr. Lara Mulch   6. What is your office phone number:[phone]    7.   What is your office fax number: [phone]  8.   Anesthesia type (None, local, MAC, general) ? Not specified   Basil Dess Genever Hentges 05/08/2020, 4:50 PM  _________________________________________________________________   (provider comments below)

## 2020-05-09 NOTE — Telephone Encounter (Signed)
   Primary Cardiologist: Dr. Agustin Cree  Chart reviewed as part of pre-operative protocol coverage. Patient was last seen by Dr. Agustin Cree in 08/2019. Patient was contacted today for further pre-op evaluation and reported doing well since last visit. He denied any chest pain, shortness of breath, orthopnea, PND, edema, palpitation, dizziness, or syncope. He is able to complete >4.0 METS without any problems. Given past medical history and time since last visit, based on ACC/AHA guidelines, MALLIE LINNEMANN would be at acceptable risk for the planned procedure without further cardiovascular testing.   Patient only has mild non-obstructive CAD. Therefore, OK to hold Aspirin if needed prior to procedure. Would restart this when able following procedure.   The patient was advised that if he develops new symptoms prior to surgery to contact our office to arrange for a follow-up visit, and he verbalized understanding.  I will route this recommendation to the requesting party via Epic fax function and remove from pre-op pool.  Please call with questions.  Darreld Mclean, PA-C 05/09/2020, 1:21 PM

## 2020-05-12 ENCOUNTER — Other Ambulatory Visit: Payer: Self-pay | Admitting: Cardiology

## 2020-05-12 NOTE — Telephone Encounter (Signed)
Rx refill sent to pharmacy. 

## 2020-05-14 DIAGNOSIS — G8918 Other acute postprocedural pain: Secondary | ICD-10-CM | POA: Diagnosis not present

## 2020-05-14 DIAGNOSIS — M1712 Unilateral primary osteoarthritis, left knee: Secondary | ICD-10-CM | POA: Diagnosis not present

## 2020-05-14 DIAGNOSIS — Y999 Unspecified external cause status: Secondary | ICD-10-CM | POA: Diagnosis not present

## 2020-05-14 DIAGNOSIS — M659 Synovitis and tenosynovitis, unspecified: Secondary | ICD-10-CM | POA: Diagnosis not present

## 2020-05-14 DIAGNOSIS — M94262 Chondromalacia, left knee: Secondary | ICD-10-CM | POA: Diagnosis not present

## 2020-05-14 DIAGNOSIS — X58XXXA Exposure to other specified factors, initial encounter: Secondary | ICD-10-CM | POA: Diagnosis not present

## 2020-05-14 DIAGNOSIS — S83232A Complex tear of medial meniscus, current injury, left knee, initial encounter: Secondary | ICD-10-CM | POA: Diagnosis not present

## 2020-05-20 DIAGNOSIS — Z9889 Other specified postprocedural states: Secondary | ICD-10-CM

## 2020-05-20 HISTORY — DX: Other specified postprocedural states: Z98.890

## 2020-05-21 DIAGNOSIS — M25462 Effusion, left knee: Secondary | ICD-10-CM | POA: Diagnosis not present

## 2020-05-21 DIAGNOSIS — M25662 Stiffness of left knee, not elsewhere classified: Secondary | ICD-10-CM | POA: Diagnosis not present

## 2020-05-21 DIAGNOSIS — R262 Difficulty in walking, not elsewhere classified: Secondary | ICD-10-CM | POA: Diagnosis not present

## 2020-05-21 DIAGNOSIS — M25562 Pain in left knee: Secondary | ICD-10-CM | POA: Diagnosis not present

## 2020-06-04 DIAGNOSIS — M25562 Pain in left knee: Secondary | ICD-10-CM | POA: Diagnosis not present

## 2020-06-04 DIAGNOSIS — M25662 Stiffness of left knee, not elsewhere classified: Secondary | ICD-10-CM | POA: Diagnosis not present

## 2020-06-04 DIAGNOSIS — M25462 Effusion, left knee: Secondary | ICD-10-CM | POA: Diagnosis not present

## 2020-06-04 DIAGNOSIS — R262 Difficulty in walking, not elsewhere classified: Secondary | ICD-10-CM | POA: Diagnosis not present

## 2020-07-14 ENCOUNTER — Other Ambulatory Visit: Payer: Self-pay | Admitting: Cardiology

## 2020-07-14 NOTE — Telephone Encounter (Signed)
Rx refill sent to pharmacy. 

## 2020-07-17 ENCOUNTER — Other Ambulatory Visit: Payer: Self-pay | Admitting: Cardiology

## 2020-07-21 ENCOUNTER — Other Ambulatory Visit: Payer: Self-pay | Admitting: Dermatology

## 2020-07-21 DIAGNOSIS — X32XXXA Exposure to sunlight, initial encounter: Secondary | ICD-10-CM | POA: Diagnosis not present

## 2020-07-21 DIAGNOSIS — D225 Melanocytic nevi of trunk: Secondary | ICD-10-CM | POA: Diagnosis not present

## 2020-07-21 DIAGNOSIS — L570 Actinic keratosis: Secondary | ICD-10-CM | POA: Diagnosis not present

## 2020-07-21 DIAGNOSIS — N62 Hypertrophy of breast: Secondary | ICD-10-CM

## 2020-07-21 DIAGNOSIS — Z1283 Encounter for screening for malignant neoplasm of skin: Secondary | ICD-10-CM | POA: Diagnosis not present

## 2020-07-25 ENCOUNTER — Other Ambulatory Visit: Payer: Self-pay | Admitting: Cardiology

## 2020-07-25 NOTE — Telephone Encounter (Signed)
Rx refill sent to pharmacy. 

## 2020-08-05 ENCOUNTER — Encounter: Payer: Self-pay | Admitting: Family Medicine

## 2020-08-05 ENCOUNTER — Ambulatory Visit (INDEPENDENT_AMBULATORY_CARE_PROVIDER_SITE_OTHER): Payer: BC Managed Care – PPO | Admitting: Family Medicine

## 2020-08-05 ENCOUNTER — Other Ambulatory Visit: Payer: Self-pay

## 2020-08-05 VITALS — BP 100/72 | HR 61 | Temp 98.2°F | Ht 68.0 in | Wt 199.5 lb

## 2020-08-05 DIAGNOSIS — I429 Cardiomyopathy, unspecified: Secondary | ICD-10-CM

## 2020-08-05 DIAGNOSIS — N6310 Unspecified lump in the right breast, unspecified quadrant: Secondary | ICD-10-CM

## 2020-08-05 DIAGNOSIS — E7800 Pure hypercholesterolemia, unspecified: Secondary | ICD-10-CM | POA: Diagnosis not present

## 2020-08-05 DIAGNOSIS — Z125 Encounter for screening for malignant neoplasm of prostate: Secondary | ICD-10-CM | POA: Diagnosis not present

## 2020-08-05 DIAGNOSIS — I5020 Unspecified systolic (congestive) heart failure: Secondary | ICD-10-CM

## 2020-08-05 DIAGNOSIS — Z1211 Encounter for screening for malignant neoplasm of colon: Secondary | ICD-10-CM

## 2020-08-05 DIAGNOSIS — N62 Hypertrophy of breast: Secondary | ICD-10-CM | POA: Diagnosis not present

## 2020-08-05 DIAGNOSIS — B351 Tinea unguium: Secondary | ICD-10-CM

## 2020-08-05 MED ORDER — TERBINAFINE HCL 250 MG PO TABS: 250.0000 mg | ORAL_TABLET | Freq: Every day | ORAL | 0 refills | Status: DC

## 2020-08-05 NOTE — Progress Notes (Signed)
Patient ID: Lucas Willis, male    DOB: 1956/05/04, 65 y.o.   MRN: [id number]  This visit was conducted in person.  BP 100/72   Pulse 61   Temp 98.2 F (36.8 C) (Temporal)   Ht 5\' 8"  (1.727 m)   Wt 199 lb 8 oz (90.5 kg)   SpO2 94%   BMI 30.33 kg/m    CC:  Chief Complaint  Patient presents with  . Annual Exam    Subjective:   HPI: Lucas Willis is a 65 y.o. male presenting on 08/05/2020 for Annual Exam  Cardiomyopathy, HFrEF and CAD followed by cardiologist.  Normal ECHO last OV. Reviewed last OV with Dr. Agustin Cree.   Due for routine labs including cholesterol and DM check.  Sees dermatology for daughters history of melanoma.  He has noted a lump near nipple on right breast , presents for 3-5 month, size increasing.  Set up with mammogram/ [place].  Spironolactone could cause this issue. He is requesting lab eval for hormones... total and free testosterone, estradiol, DHEA, TSH  No family history of breast cancer.    He is on spironolactone.   Exercise 2-3 times a week.  Relevant past medical, surgical, family and social history reviewed and updated as indicated. Interim medical history since our last visit reviewed. Allergies and medications reviewed and updated. Outpatient Medications Prior to Visit  Medication Sig Dispense Refill  . Ascorbic Acid (VITAMIN C PO) Take 2,000 mg by mouth daily.    . ASPIRIN LOW DOSE 81 MG EC tablet TAKE 1 TABLET BY MOUTH EVERY DAY 90 tablet 3  . atorvastatin (LIPITOR) 40 MG tablet TAKE 1 TABLET BY MOUTH EVERY DAY 90 tablet 3  . b complex vitamins tablet Take 1 tablet by mouth daily.    . carvedilol (COREG) 6.25 MG tablet TAKE 1 TABLET BY MOUTH TWICE A DAY WITH MEALS 180 tablet 3  . cetirizine (ZYRTEC) 10 MG tablet Take 10 mg by mouth daily.    . Cholecalciferol 25 MCG (1000 UT) tablet Take 1,000 Units by mouth daily.    . Chondroitin Sulfate-Vit C-Mn (CHONDROITIN SULFATE COMPLEX PO) Take 2 tablets by mouth daily.    . Coenzyme Q10  (COQ10 PO) Take 1 capsule by mouth daily.    Marland Kitchen ENTRESTO 49-51 MG TAKE 1 TABLET BY MOUTH TWICE A DAY 180 tablet 2  . MAGNESIUM PO Take 1 tablet by mouth daily.    . Multiple Vitamins-Minerals (CENTRUM SILVER ULTRA MENS PO) Take 1 tablet by mouth daily.    . nitroGLYCERIN (NITROSTAT) 0.4 MG SL tablet Place 0.4 mg under the tongue every 5 (five) minutes as needed for chest pain.    Marland Kitchen spironolactone (ALDACTONE) 25 MG tablet TAKE 1 TABLET BY MOUTH EVERY DAY 90 tablet 3  . VITAMIN E PO Take 1 capsule by mouth daily.     No facility-administered medications prior to visit.     Per HPI unless specifically indicated in ROS section below Review of Systems  Constitutional: Negative for chills, fatigue and fever.  HENT: Negative for congestion and ear pain.   Eyes: Negative for pain and redness.  Respiratory: Negative for cough and shortness of breath.   Cardiovascular: Negative for chest pain, palpitations and leg swelling.  Gastrointestinal: Negative for abdominal pain, blood in stool, constipation, diarrhea, nausea and vomiting.  Genitourinary: Negative for dysuria.  Musculoskeletal: Negative for arthralgias and myalgias.  Skin: Negative for rash.  Neurological: Negative for dizziness, syncope, light-headedness and headaches.  Psychiatric/Behavioral: Negative for dysphoric mood. The patient is not nervous/anxious.    Objective:  BP 100/72   Pulse 61   Temp 98.2 F (36.8 C) (Temporal)   Ht 5\' 8"  (1.727 m)   Wt 199 lb 8 oz (90.5 kg)   SpO2 94%   BMI 30.33 kg/m   Wt Readings from Last 3 Encounters:  08/05/20 199 lb 8 oz (90.5 kg)  10/31/19 193 lb (87.5 kg)  09/21/19 205 lb (93 kg)      Physical Exam Constitutional:      General: He is not in acute distress.    Appearance: Normal appearance. He is well-developed. He is not ill-appearing or toxic-appearing.  HENT:     Head: Normocephalic and atraumatic.     Right Ear: Hearing, tympanic membrane, ear canal and external ear normal.      Left Ear: Hearing, tympanic membrane, ear canal and external ear normal.     Nose: Nose normal.     Mouth/Throat:     Pharynx: Uvula midline.  Eyes:     General: Lids are normal. Lids are everted, no foreign bodies appreciated.     Conjunctiva/sclera: Conjunctivae normal.     Pupils: Pupils are equal, round, and reactive to light.  Neck:     Thyroid: No thyroid mass or thyromegaly.     Vascular: No carotid bruit.     Trachea: Trachea and phonation normal.  Cardiovascular:     Rate and Rhythm: Normal rate and regular rhythm.     Pulses: Normal pulses.     Heart sounds: S1 normal and S2 normal. No murmur heard. No gallop.   Pulmonary:     Breath sounds: Normal breath sounds. No wheezing, rhonchi or rales.  Chest:  Breasts:     Breasts are asymmetrical.     Right: Mass present. No inverted nipple, nipple discharge, skin change or tenderness.        Comments: Swelling mobile mass above and behind right nipple Abdominal:     General: Bowel sounds are normal.     Palpations: Abdomen is soft.     Tenderness: There is no abdominal tenderness. There is no guarding or rebound.     Hernia: No hernia is present.  Musculoskeletal:     Cervical back: Normal range of motion and neck supple.  Lymphadenopathy:     Cervical: No cervical adenopathy.  Skin:    General: Skin is warm and dry.     Findings: No rash.  Neurological:     Mental Status: He is alert.     Cranial Nerves: No cranial nerve deficit.     Sensory: No sensory deficit.     Gait: Gait normal.     Deep Tendon Reflexes: Reflexes are normal and symmetric.  Psychiatric:        Speech: Speech normal.        Behavior: Behavior normal.        Judgment: Judgment normal.       Results for orders placed or performed during the hospital encounter of 03/19/20  ECHOCARDIOGRAM COMPLETE  Result Value Ref Range   S' Lateral 4.19 cm   Area-P 1/2 5.02 cm2    This visit occurred during the SARS-CoV-2 public health emergency.   Safety protocols were in place, including screening questions prior to the visit, additional usage of staff PPE, and extensive cleaning of exam room while observing appropriate contact time as indicated for disinfecting solutions.   COVID 19 screen:  No recent travel  or known exposure to COVID19 The patient denies respiratory symptoms of COVID 19 at this time. The importance of social distancing was discussed today.   Assessment and Plan The patient's preventative maintenance and recommended screening tests for an annual wellness exam were reviewed in full today. Brought up to date unless services declined.  Counselled on the importance of diet, exercise, and its role in overall health and mortality. The patient's FH and SH was reviewed, including their home life, tobacco status, and drug and alcohol status.   Problem List Items Addressed This Visit    Cardiomyopathy ([place])    Normal ECHO last OV. Reviewed last OV with Dr. Agustin Cree.      Heart failure with reduced ejection fraction (HCC)    Improving EF. No current fluid overload.      Lump of right breast - Primary    Possible gynecomastia due to spironolactone but given unilateral.. refer for mammogram and [place] of breast tissue.  Instructed to talk with cardiology about stopping spironolactone.      Relevant Orders   T3, free (Completed)   T4, free (Completed)   TSH (Completed)   CBC with Differential/Platelet (Completed)   DHEA (Completed)   Testos,Total,Free and SHBG (Male) (Completed)   Estradiol (Completed)   Pure hypercholesterolemia    Due for re-eval today.      Relevant Orders   Lipid panel (Completed)   Comprehensive metabolic panel (Completed)    Other Visit Diagnoses    Prostate cancer screening       Relevant Orders   PSA (Completed)   Toenail fungus       Relevant Medications   terbinafine (LAMISIL) 250 MG tablet   Colon cancer screening       Relevant Orders   Ambulatory referral to Gastroenterology          Eliezer Lofts, MD

## 2020-08-05 NOTE — Patient Instructions (Addendum)
I will call you regarding your lab results.  Keep the appointment for breast imaging. GI will call to set up referral for colonoscopy.  Start terbinafine daily x 3 months.. Make follow up appt in 3 months for toenail eval  And liver check if not resolved.

## 2020-08-06 LAB — COMPREHENSIVE METABOLIC PANEL
ALT: 25 U/L (ref 0–53)
AST: 21 U/L (ref 0–37)
Albumin: 4.5 g/dL (ref 3.5–5.2)
Alkaline Phosphatase: 86 U/L (ref 39–117)
BUN: 22 mg/dL (ref 6–23)
CO2: 29 mEq/L (ref 19–32)
Calcium: 10 mg/dL (ref 8.4–10.5)
Chloride: 101 mEq/L (ref 96–112)
Creatinine, Ser: 0.89 mg/dL (ref 0.40–1.50)
GFR: 90.72 mL/min (ref >60.00)
Glucose, Bld: 92 mg/dL (ref 70–99)
Potassium: 4.2 mEq/L (ref 3.5–5.1)
Sodium: 140 mEq/L (ref 135–145)
Total Bilirubin: 1 mg/dL (ref 0.2–1.2)
Total Protein: 7.4 g/dL (ref 6.0–8.3)

## 2020-08-06 LAB — T4, FREE: Free T4: 0.76 ng/dL (ref 0.60–1.60)

## 2020-08-06 LAB — LIPID PANEL
Cholesterol: 147 mg/dL (ref 0–200)
HDL: 43.2 mg/dL (ref >39.00)
LDL Cholesterol: 72 mg/dL (ref 0–99)
NonHDL: 103.86
Total CHOL/HDL Ratio: 3
Triglycerides: 161 mg/dL — ABNORMAL HIGH (ref 0.0–149.0)
VLDL: 32.2 mg/dL (ref 0.0–40.0)

## 2020-08-06 LAB — CBC WITH DIFFERENTIAL/PLATELET
Basophils Absolute: 0.1 10*3/uL (ref 0.0–0.1)
Basophils Relative: 0.8 % (ref 0.0–3.0)
Eosinophils Absolute: 0.1 10*3/uL (ref 0.0–0.7)
Eosinophils Relative: 1.4 % (ref 0.0–5.0)
HCT: 39.9 % (ref 39.0–52.0)
Hemoglobin: 14.1 g/dL (ref 13.0–17.0)
Lymphocytes Relative: 31.1 % (ref 12.0–46.0)
Lymphs Abs: 2.1 10*3/uL (ref 0.7–4.0)
MCHC: 35.3 g/dL (ref 30.0–36.0)
MCV: 96.5 fl (ref 78.0–100.0)
Monocytes Absolute: 0.4 10*3/uL (ref 0.1–1.0)
Monocytes Relative: 6.3 % (ref 3.0–12.0)
Neutro Abs: 4.1 10*3/uL (ref 1.4–7.7)
Neutrophils Relative %: 60.4 % (ref 43.0–77.0)
Platelets: 243 10*3/uL (ref 150.0–400.0)
RBC: 4.13 Mil/uL — ABNORMAL LOW (ref 4.22–5.81)
RDW: 13.7 % (ref 11.5–15.5)
WBC: 6.8 10*3/uL (ref 4.0–10.5)

## 2020-08-06 LAB — T3, FREE: T3, Free: 3.2 pg/mL (ref 2.3–4.2)

## 2020-08-06 LAB — TSH: TSH: 2.07 u[IU]/mL (ref 0.35–4.50)

## 2020-08-06 LAB — PSA: PSA: 0.49 ng/mL (ref 0.10–4.00)

## 2020-08-09 LAB — TESTOS,TOTAL,FREE AND SHBG (FEMALE)
Free Testosterone: 63.9 pg/mL (ref 35.0–155.0)
Sex Hormone Binding: 44 nmol/L (ref 22–77)
Testosterone, Total, LC-MS-MS: 450 ng/dL (ref 250–1100)

## 2020-08-09 LAB — ESTRADIOL: Estradiol: 47 pg/mL — ABNORMAL HIGH (ref <=39)

## 2020-08-09 LAB — DHEA: DHEA: 110 ng/dL — ABNORMAL LOW (ref 147–1760)

## 2020-08-12 ENCOUNTER — Encounter: Payer: Self-pay | Admitting: Gastroenterology

## 2020-08-15 ENCOUNTER — Telehealth: Payer: Self-pay | Admitting: Family Medicine

## 2020-08-15 NOTE — Telephone Encounter (Signed)
Pt called in were returning Butch Penny phone call

## 2020-08-15 NOTE — Telephone Encounter (Signed)
Returned Mr. Ethington call and got his voicemail.  I will try to reach him again on Monday.

## 2020-08-18 NOTE — Telephone Encounter (Signed)
Mr. Wandel reviewed his labs on MyChart and replied back to Dr. Diona Browner about his results.

## 2020-09-02 ENCOUNTER — Ambulatory Visit
Admission: RE | Admit: 2020-09-02 | Discharge: 2020-09-02 | Disposition: A | Discharge status: Home / Self Care | Payer: BC Managed Care – PPO | Source: Ambulatory Visit | Attending: Dermatology | Admitting: Dermatology

## 2020-09-02 ENCOUNTER — Other Ambulatory Visit: Payer: Self-pay

## 2020-09-02 ENCOUNTER — Ambulatory Visit: Payer: BC Managed Care – PPO

## 2020-09-02 DIAGNOSIS — R928 Other abnormal and inconclusive findings on diagnostic imaging of breast: Secondary | ICD-10-CM | POA: Diagnosis not present

## 2020-09-02 DIAGNOSIS — N62 Hypertrophy of breast: Secondary | ICD-10-CM

## 2020-09-22 ENCOUNTER — Ambulatory Visit: Payer: BC Managed Care – PPO | Admitting: Cardiology

## 2020-09-26 DIAGNOSIS — N6310 Unspecified lump in the right breast, unspecified quadrant: Secondary | ICD-10-CM

## 2020-09-26 HISTORY — DX: Unspecified lump in the right breast, unspecified quadrant: N63.10

## 2020-09-26 NOTE — Assessment & Plan Note (Signed)
Possible gynecomastia due to spironolactone but given unilateral.. refer for mammogram and [place] of breast tissue.  Instructed to talk with cardiology about stopping spironolactone.

## 2020-09-26 NOTE — Assessment & Plan Note (Signed)
Due for re-eval today. 

## 2020-09-26 NOTE — Assessment & Plan Note (Signed)
Normal ECHO last OV. Reviewed last OV with Dr. Agustin Cree.

## 2020-09-26 NOTE — Assessment & Plan Note (Signed)
Improving EF. No current fluid overload.

## 2020-09-29 ENCOUNTER — Encounter: Payer: Self-pay | Admitting: Gastroenterology

## 2020-10-01 ENCOUNTER — Encounter: Payer: Self-pay | Admitting: Cardiology

## 2020-10-01 ENCOUNTER — Ambulatory Visit: Payer: BC Managed Care – PPO | Admitting: Cardiology

## 2020-10-01 ENCOUNTER — Ambulatory Visit (INDEPENDENT_AMBULATORY_CARE_PROVIDER_SITE_OTHER): Payer: BC Managed Care – PPO

## 2020-10-01 ENCOUNTER — Other Ambulatory Visit: Payer: Self-pay

## 2020-10-01 VITALS — BP 124/80 | HR 61 | Ht 70.0 in | Wt 208.0 lb

## 2020-10-01 DIAGNOSIS — R55 Syncope and collapse: Secondary | ICD-10-CM | POA: Diagnosis not present

## 2020-10-01 DIAGNOSIS — I2510 Atherosclerotic heart disease of native coronary artery without angina pectoris: Secondary | ICD-10-CM

## 2020-10-01 DIAGNOSIS — I5022 Chronic systolic (congestive) heart failure: Secondary | ICD-10-CM

## 2020-10-01 DIAGNOSIS — I420 Dilated cardiomyopathy: Secondary | ICD-10-CM | POA: Diagnosis not present

## 2020-10-01 DIAGNOSIS — E7800 Pure hypercholesterolemia, unspecified: Secondary | ICD-10-CM | POA: Diagnosis not present

## 2020-10-01 DIAGNOSIS — I5020 Unspecified systolic (congestive) heart failure: Secondary | ICD-10-CM

## 2020-10-01 NOTE — Patient Instructions (Signed)
Medication Instructions:  Your physician recommends that you continue on your current medications as directed. Please refer to the Current Medication list given to you today.  *If you need a refill on your cardiac medications before your next appointment, please call your pharmacy*   Lab Work: None If you have labs (blood work) drawn today and your tests are completely normal, you will receive your results only by: . MyChart Message (if you have MyChart) OR . A paper copy in the mail If you have any lab test that is abnormal or we need to change your treatment, we will call you to review the results.   Testing/Procedures: A zio monitor was ordered today. It will remain on for 7 days. You will then return monitor and event diary in provided box. It takes 1-2 weeks for report to be downloaded and returned to us. We will call you with the results. If monitor falls off or has orange flashing light, please call Zio for further instructions.      Follow-Up: At [facility], you and your health needs are our priority.  As part of our continuing mission to provide you with exceptional heart care, we have created designated Provider Care Teams.  These Care Teams include your primary Cardiologist (physician) and Advanced Practice Providers (APPs -  Physician Assistants and Nurse Practitioners) who all work together to provide you with the care you need, when you need it.  We recommend signing up for the patient portal called "MyChart".  Sign up information is provided on this After Visit Summary.  MyChart is used to connect with patients for Virtual Visits (Telemedicine).  Patients are able to view lab/test results, encounter notes, upcoming appointments, etc.  Non-urgent messages can be sent to your provider as well.   To learn more about what you can do with MyChart, go to [email or web address].com.    Your next appointment:   3 month(s)  The format for your next appointment:   In  Person  Provider:   Robert Krasowski, MD   Other Instructions    

## 2020-10-08 ENCOUNTER — Encounter: Payer: BC Managed Care – PPO | Admitting: Gastroenterology

## 2020-10-15 DIAGNOSIS — R55 Syncope and collapse: Secondary | ICD-10-CM | POA: Diagnosis not present

## 2020-10-24 ENCOUNTER — Encounter: Payer: BC Managed Care – PPO | Admitting: Gastroenterology

## 2020-10-30 ENCOUNTER — Telehealth: Payer: Self-pay

## 2020-10-30 ENCOUNTER — Telehealth: Payer: Self-pay | Admitting: Emergency Medicine

## 2020-10-30 DIAGNOSIS — I472 Ventricular tachycardia, unspecified: Secondary | ICD-10-CM

## 2020-10-30 MED ORDER — CARVEDILOL 12.5 MG PO TABS: 12.5000 mg | ORAL_TABLET | Freq: Two times a day (BID) | ORAL | 1 refills | Status: DC

## 2020-10-30 NOTE — Telephone Encounter (Signed)
Spoke with patient to schedule an Echo.  Patient is scheduled for Echo on July 15th, but is requesting Dr. Raliegh Ip can revisit the Echo from October and confirm whether the problem existed them and was overlooked. He is requesting a call back with a response.

## 2020-10-30 NOTE — Telephone Encounter (Signed)
Called patient informed him of results. He will increase carvedilol 12.5 mg twice daily. Echo has been ordered will have schedulers get him scheduled. Ep referral sent. Patient aware he will be contacted for echo and ep referral. No further questions.

## 2020-10-30 NOTE — Telephone Encounter (Signed)
-----   Message from Park Liter, MD sent at 10/29/2020  9:26 PM EDT ----- Multiple episodes of nonsustained ventricular tachycardia.  Please double the dose of carvedilol to 12.5 twice daily, let schedule him to have an echocardiogram. Schedule him also to see EP for more advanced

## 2020-11-05 NOTE — Telephone Encounter (Signed)
Spoke to patient. Went over why the echo is recommended due to his irregular heart rhythm. Patient understood. Also helped him understand why a EP referral was sent. Patient understood he has not further questions.

## 2020-11-12 ENCOUNTER — Other Ambulatory Visit: Payer: Self-pay

## 2020-11-12 ENCOUNTER — Ambulatory Visit (HOSPITAL_BASED_OUTPATIENT_CLINIC_OR_DEPARTMENT_OTHER)
Admission: RE | Admit: 2020-11-12 | Discharge: 2020-11-12 | Disposition: A | Discharge status: Home / Self Care | Payer: BC Managed Care – PPO | Source: Ambulatory Visit | Attending: Cardiology | Admitting: Cardiology

## 2020-11-12 DIAGNOSIS — I472 Ventricular tachycardia, unspecified: Secondary | ICD-10-CM

## 2020-11-12 LAB — ECHOCARDIOGRAM COMPLETE
Area-P 1/2: 2.8 cm2
S' Lateral: 3.38 cm

## 2020-11-13 ENCOUNTER — Ambulatory Visit (AMBULATORY_SURGERY_CENTER): Payer: BC Managed Care – PPO

## 2020-11-13 VITALS — Ht 69.5 in | Wt 195.0 lb

## 2020-11-13 DIAGNOSIS — Z86010 Personal history of colonic polyps: Secondary | ICD-10-CM

## 2020-11-13 MED ORDER — NA SULFATE-K SULFATE-MG SULF 17.5-3.13-1.6 GM/177ML PO SOLN: 1.0000 | Freq: Once | ORAL | 0 refills | Status: AC

## 2020-11-13 NOTE — Progress Notes (Signed)
Pre visit completed via phone call; Patient verified name, DOB, and address; No egg or soy allergy known to patient  No issues with past sedation with any surgeries or procedures Patient denies ever being told they had issues or difficulty with intubation  No FH of Malignant Hyperthermia No diet pills per patient No home 02 use per patient  No blood thinners per patient  Pt denies issues with constipation  No A fib or A flutter  EMMI video via MyChart  COVID 19 guidelines implemented in PV today with Pt and RN  NO PA's for preps discussed with pt in PV today  Discussed with pt there will be an out-of-pocket cost for prep and that varies from $0 to 70 dollars  Due to the COVID-19 pandemic we are asking patients to follow certain guidelines.  Pt aware of COVID protocols and [facility] guidelines   

## 2020-11-25 ENCOUNTER — Ambulatory Visit: Payer: BC Managed Care – PPO | Admitting: Cardiology

## 2020-11-25 ENCOUNTER — Other Ambulatory Visit: Payer: Self-pay

## 2020-11-25 ENCOUNTER — Encounter: Payer: Self-pay | Admitting: Cardiology

## 2020-11-25 VITALS — BP 100/80 | HR 58 | Ht 69.5 in | Wt 205.4 lb

## 2020-11-25 DIAGNOSIS — I472 Ventricular tachycardia, unspecified: Secondary | ICD-10-CM

## 2020-11-25 NOTE — Progress Notes (Signed)
Electrophysiology Office Note   Date:  11/25/2020   ID:  Lucas Willis, DOB 1955-12-29, MRN [id number]  PCP:  Jinny Sanders, MD  Cardiologist:  Agustin Cree Primary Electrophysiologist:  Lowery Paullin Meredith Leeds, MD    Chief Complaint: PVC   History of Present Illness: Lucas Willis is a 65 y.o. male who is being seen today for the evaluation of PVC at the request of Park Liter, MD. Presenting today for electrophysiology evaluation.  He has a history significant for nonischemic cardiomyopathy, hyperlipidemia.  In 2017, he has severely reduced ejection fraction.  Catheterization showed no significant coronary artery disease.  He is lost 15 pounds with diet and exercise.  He goes to the gym on a regular basis.  His ejection fraction has improved to 40 to 45%.  He wore a cardiac monitor that showed episodes of nonsustained VT, longest 10 beats.  Today, he denies symptoms of palpitations, chest pain, shortness of breath, orthopnea, PND, lower extremity edema, claudication, dizziness, presyncope, syncope, bleeding, or neurologic sequela. The patient is tolerating medications without difficulties.    Past Medical History:  Diagnosis Date   CAD, nonobstructive on 2017 cath 07/19/2017   Cardiomyopathy ([place]) 75/02/2584   Chronic systolic heart failure ([place]) 09/04/2015   Fatigue 07/19/2017   Heart failure with reduced ejection fraction ([place]) 09/04/2015   Followed by Dr. Agustin Cree [facility]iology. EF 43% on ECHO 07/19/2016  Repeat 2021:  EF40-46%   Leg cramps 05/14/2014   Osteoarthritis of left knee 04/05/2018   Pure hypercholesterolemia 06/04/2014   Past Surgical History:  Procedure Laterality Date   CARDIAC CATHETERIZATION N/A 09/11/2015   Procedure: Right/Left Heart Cath and Coronary Angiography;  Surgeon: Peter M Martinique, MD;  Location: [facility];  Service: Cardiovascular;  Laterality: N/A;   COLONOSCOPY  2011   MS-F/V-movi(good)-TA-recall 24yrs   CYST REMOVAL LEG Left 1970    back of L knee   HERNIA REPAIR Left 1979   inguinal   KNEE ARTHROSCOPY Left 2021   PLANTAR'S WART EXCISION  1972   POLYPECTOMY  2011   TA   SHOULDER SURGERY Right 2008   Dislocation Repair   TONSILLECTOMY     WISDOM TOOTH EXTRACTION       Current Outpatient Medications  Medication Sig Dispense Refill   Ascorbic Acid (VITAMIN C PO) Take 2,000 mg by mouth daily.     ASPIRIN LOW DOSE 81 MG EC tablet TAKE 1 TABLET BY MOUTH EVERY DAY 90 tablet 3   atorvastatin (LIPITOR) 40 MG tablet TAKE 1 TABLET BY MOUTH EVERY DAY 90 tablet 3   b complex vitamins tablet Take 1 tablet by mouth daily. Unknown strength     BIOTIN PO Take 1 capsule by mouth daily. Unknown strength     carvedilol (COREG) 25 MG tablet Take 25 mg by mouth 2 (two) times daily with a meal.     cetirizine (ZYRTEC) 10 MG tablet Take 10 mg by mouth daily.     Cholecalciferol 25 MCG (1000 UT) tablet Take 2,000 Units by mouth daily.     Chondroitin Sulfate-Vit C-Mn (CHONDROITIN SULFATE COMPLEX PO) Take 2 tablets by mouth daily. Unknown strength     ENTRESTO 49-51 MG TAKE 1 TABLET BY MOUTH TWICE A DAY 180 tablet 2   MAGNESIUM PO Take 1 tablet by mouth daily. Unknown strength     Multiple Vitamins-Minerals (CENTRUM SILVER ULTRA MENS PO) Take 1 tablet by mouth daily. Unknown strength     nitroGLYCERIN (NITROSTAT) 0.4 MG SL  tablet Place 0.4 mg under the tongue every 5 (five) minutes as needed for chest pain.     spironolactone (ALDACTONE) 25 MG tablet TAKE 1 TABLET BY MOUTH EVERY DAY 90 tablet 3   terbinafine (LAMISIL) 250 MG tablet Take 1 tablet (250 mg total) by mouth daily. 90 tablet 0   No current facility-administered medications for this visit.    Allergies:   Demerol [meperidine] and Sulfonamide derivatives   Social History:  The patient  reports that he has never smoked. He has never used smokeless tobacco. He reports current alcohol use. He reports that he does not use drugs.   Family History:  The patient's family  history includes Heart failure in his mother.    ROS:  Please see the history of present illness.   Otherwise, review of systems is positive for none.   All other systems are reviewed and negative.    PHYSICAL EXAM: VS:  BP 100/80   Pulse (!) 58   Ht 5' 9.5" (1.765 m)   Wt 205 lb 6.4 oz (93.2 kg)   SpO2 98%   BMI 29.90 kg/m  , BMI Body mass index is 29.9 kg/m. GEN: Well nourished, well developed, in no acute distress  HEENT: normal  Neck: no JVD, carotid bruits, or masses Cardiac: RRR; no murmurs, rubs, or gallops,no edema  Respiratory:  clear to auscultation bilaterally, normal work of breathing GI: soft, nontender, nondistended, + BS MS: no deformity or atrophy  Skin: warm and dry Neuro:  Strength and sensation are intact Psych: euthymic mood, full affect  EKG:  EKG is ordered today. Personal review of the ekg ordered shows sinus rhythm, atrial bigeminy, rate 58  Recent Labs: 08/05/2020: ALT 25; BUN 22; Creatinine, Ser 0.89; Hemoglobin 14.1; Platelets 243.0; Potassium 4.2; Sodium 140; TSH 2.07    Lipid Panel     Component Value Date/Time   CHOL 147 08/05/2020 1708   CHOL 155 07/23/2019 0902   TRIG 161.0 (H) 08/05/2020 1708   TRIG 258 (HH) 06/08/2006 1002   HDL 43.20 08/05/2020 1708   HDL 42 07/23/2019 0902   CHOLHDL 3 08/05/2020 1708   VLDL 32.2 08/05/2020 1708   LDLCALC 72 08/05/2020 1708   LDLCALC 82 07/23/2019 0902   LDLCALC 89 04/11/2018 0734   LDLDIRECT 142.0 09/10/2009 1556     Wt Readings from Last 3 Encounters:  11/25/20 205 lb 6.4 oz (93.2 kg)  11/13/20 195 lb (88.5 kg)  10/01/20 208 lb (94.3 kg)      Other studies Reviewed: Additional studies/ records that were reviewed today include: TTE 11/12/20  Review of the above records today demonstrates:   1. Left ventricular ejection fraction, by estimation, is 45 to 50%. The  left ventricle has mildly decreased function. The left ventricle  demonstrates akinesis in the basal inferior wall with global  hypokinesis  in the remaining myocardial wall segement.  Left ventricular diastolic parameters are consistent with Grade I  diastolic dysfunction (impaired relaxation).   2. Right ventricular systolic function is normal. The right ventricular  size is normal. There is normal pulmonary artery systolic pressure.   3. The mitral valve is normal in structure. No evidence of mitral valve  regurgitation. No evidence of mitral stenosis.   4. The aortic valve is normal in structure. Aortic valve regurgitation is  not visualized. No aortic stenosis is present.   5. The inferior vena cava is normal in size with greater than 50%  respiratory variability, suggesting right atrial pressure of  3 mmHg.  Cardiac monitor 10/29/2020 personally reviewed Multiple episode of nonsustained ventricular tachycardia identified. Longest episode 10 beats at rate of 163, fastest episode at rate of 235 bpm. 52 episode of supraventricular tachycardia noted. Triggered events for PVCs  ASSESSMENT AND PLAN:  1.  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  Currently on optimal therapy with Entresto, Aldactone, carvedilol.  Currently well compensated.  No changes.  2.  Hyperlipidemia: Plan per primary cardiology.  3.  Nonsustained VT: He had 2 episodes of nonsustained VT on cardiac monitor.  He was asymptomatic from this.  His carvedilol dose was increased.  He does have mild systolic heart failure, though at this point, would hold off on further therapy, as these were all short arrhythmias.  Agree with increasing his carvedilol dose.  If further arrhythmias occur, would potentially plan for further increased carvedilol.  We Gavina Dildine hold off on antiarrhythmics for now.  Case discussed with primary cardiology  Current medicines are reviewed at length with the patient today.   The patient does not have concerns regarding his medicines.  The following changes were made today:   none  Labs/ tests ordered today include:  Orders Placed This Encounter  Procedures   EKG 12-Lead     Disposition:   FU with Torrez Renfroe as needed  Signed, Lisseth Brazeau Meredith Leeds, MD  11/25/2020 12:30 PM     [place] [address] [place] [address] [phone] (office) [phone] (fax)

## 2020-11-25 NOTE — Patient Instructions (Signed)
Medication Instructions:  °Your physician recommends that you continue on your current medications as directed. Please refer to the Current Medication list given to you today. ° °*If you need a refill on your cardiac medications before your next appointment, please call your pharmacy* ° ° °Lab Work: °None ordered ° ° °Testing/Procedures: °None ordered ° ° °Follow-Up: °At [facility], you and your health needs are our priority.  As part of our continuing mission to provide you with exceptional heart care, we have created designated Provider Care Teams.  These Care Teams include your primary Cardiologist (physician) and Advanced Practice Providers (APPs -  Physician Assistants and Nurse Practitioners) who all work together to provide you with the care you need, when you need it. ° °Your next appointment:   °As   needed ° °The format for your next appointment:   °In Person ° °Provider:   °Will Camnitz, MD ° ° ° °Thank you for choosing [facility]!! ° ° °Ashanty Coltrane, RN °[phone] ° °

## 2020-12-03 ENCOUNTER — Encounter: Payer: Self-pay | Admitting: Gastroenterology

## 2020-12-05 ENCOUNTER — Ambulatory Visit (AMBULATORY_SURGERY_CENTER): Payer: BC Managed Care – PPO | Admitting: Gastroenterology

## 2020-12-05 ENCOUNTER — Other Ambulatory Visit: Payer: Self-pay

## 2020-12-05 ENCOUNTER — Encounter: Payer: Self-pay | Admitting: Gastroenterology

## 2020-12-05 VITALS — BP 92/61 | HR 59 | Temp 96.2°F | Resp 15 | Ht 69.5 in | Wt 195.0 lb

## 2020-12-05 DIAGNOSIS — Z86010 Personal history of colonic polyps: Secondary | ICD-10-CM | POA: Diagnosis not present

## 2020-12-05 DIAGNOSIS — D123 Benign neoplasm of transverse colon: Secondary | ICD-10-CM

## 2020-12-05 DIAGNOSIS — Z1211 Encounter for screening for malignant neoplasm of colon: Secondary | ICD-10-CM | POA: Diagnosis not present

## 2020-12-05 MED ORDER — SODIUM CHLORIDE 0.9 % IV SOLN: 500.0000 mL | Freq: Once | INTRAVENOUS | Status: DC

## 2020-12-05 NOTE — Progress Notes (Signed)
Called to room to assist during endoscopic procedure.  Patient ID and intended procedure confirmed with present staff. Received instructions for my participation in the procedure from the performing physician.  

## 2020-12-05 NOTE — Progress Notes (Signed)
VS by Susan Moore  Pt's states no medical or surgical changes since previsit or office visit.  

## 2020-12-05 NOTE — Progress Notes (Signed)
pt tolerated well. VSS. awake and to recovery. Report given to RN.  

## 2020-12-05 NOTE — Patient Instructions (Signed)
YOU HAD AN ENDOSCOPIC PROCEDURE TODAY AT THE [facility]:   Refer to the procedure report that was given to you for any specific questions about what was found during the examination.  If the procedure report does not answer your questions, please call your gastroenterologist to clarify.  If you requested that your care partner not be given the details of your procedure findings, then the procedure report has been included in a sealed envelope for you to review at your convenience later.  YOU SHOULD EXPECT: Some feelings of bloating in the abdomen. Passage of more gas than usual.  Walking can help get rid of the air that was put into your GI tract during the procedure and reduce the bloating. If you had a lower endoscopy (such as a colonoscopy or flexible sigmoidoscopy) you may notice spotting of blood in your stool or on the toilet paper. If you underwent a bowel prep for your procedure, you may not have a normal bowel movement for a few days.  Please Note:  You might notice some irritation and congestion in your nose or some drainage.  This is from the oxygen used during your procedure.  There is no need for concern and it should clear up in a day or so.  SYMPTOMS TO REPORT IMMEDIATELY:   Following lower endoscopy (colonoscopy or flexible sigmoidoscopy):  Excessive amounts of blood in the stool  Significant tenderness or worsening of abdominal pains  Swelling of the abdomen that is new, acute  Fever of 100F or higher   Following upper endoscopy (EGD)  Vomiting of blood or coffee ground material  New chest pain or pain under the shoulder blades  Painful or persistently difficult swallowing  New shortness of breath  Fever of 100F or higher  Black, tarry-looking stools  For urgent or emergent issues, a gastroenterologist can be reached at any hour by calling [phone]. Do not use MyChart messaging for urgent concerns.    DIET:  We do recommend a small meal at first, but  then you may proceed to your regular diet.  Drink plenty of fluids but you should avoid alcoholic beverages for 24 hours.  ACTIVITY:  You should plan to take it easy for the rest of today and you should NOT DRIVE or use heavy machinery until tomorrow (because of the sedation medicines used during the test).    FOLLOW UP: Our staff will call the number listed on your records 48-72 hours following your procedure to check on you and address any questions or concerns that you may have regarding the information given to you following your procedure. If we do not reach you, we will leave a message.  We will attempt to reach you two times.  During this call, we will ask if you have developed any symptoms of COVID 19. If you develop any symptoms (ie: fever, flu-like symptoms, shortness of breath, cough etc.) before then, please call [phone].  If you test positive for Covid 19 in the 2 weeks post procedure, please call and report this information to us.    If any biopsies were taken you will be contacted by phone or by letter within the next 1-3 weeks.  Please call us at [phone] if you have not heard about the biopsies in 3 weeks.    SIGNATURES/CONFIDENTIALITY: You and/or your care partner have signed paperwork which will be entered into your electronic medical record.  These signatures attest to the fact that that the information above on   your After Visit Summary has been reviewed and is understood.  Full responsibility of the confidentiality of this discharge information lies with you and/or your care-partner. 

## 2020-12-05 NOTE — Op Note (Signed)
[place] Patient Name: Lucas Willis Procedure Date: 12/05/2020 3:22 PM MRN: [id number] Endoscopist: Ladene Artist , MD Age: 65 Referring MD:  Date of Birth: 31-Jan-1956 Gender: Male Account #: [id number] Procedure:                Colonoscopy Indications:              Surveillance: Personal history of adenomatous                            polyps on last colonoscopy > 5 years ago Medicines:                Monitored Anesthesia Care Procedure:                Pre-Anesthesia Assessment:                           - Prior to the procedure, a History and Physical                            was performed, and patient medications and                            allergies were reviewed. The patient's tolerance of                            previous anesthesia was also reviewed. The risks                            and benefits of the procedure and the sedation                            options and risks were discussed with the patient.                            All questions were answered, and informed consent                            was obtained. Prior Anticoagulants: The patient has                            taken no previous anticoagulant or antiplatelet                            agents. ASA Grade Assessment: II - A patient with                            mild systemic disease. After reviewing the risks                            and benefits, the patient was deemed in                            satisfactory condition to undergo the procedure.  After obtaining informed consent, the colonoscope                            was passed under direct vision. Throughout the                            procedure, the patient's blood pressure, pulse, and                            oxygen saturations were monitored continuously. The                            Olympus CF-HQ190L [id number]) Colonoscope was                            introduced through the anus  and advanced to the the                            cecum, identified by appendiceal orifice and                            ileocecal valve. The ileocecal valve, appendiceal                            orifice, and rectum were photographed. The quality                            of the bowel preparation was good. The colonoscopy                            was performed without difficulty. The patient                            tolerated the procedure well. Scope In: 3:27:30 PM Scope Out: 3:42:40 PM Scope Withdrawal Time: 0 hours 12 minutes 25 seconds  Total Procedure Duration: 0 hours 15 minutes 10 seconds  Findings:                 The perianal and digital rectal examinations were                            normal.                           A 6 mm polyp was found in the transverse colon. The                            polyp was sessile. The polyp was removed with a                            cold snare. Resection and retrieval were complete.                           Multiple small-mouthed diverticula were found in  the right colon. There was no evidence of                            diverticular bleeding.                           Multiple medium-mouthed diverticula were found in                            the left colon. There was evidence of diverticular                            spasm. There was evidence of an impacted                            diverticulum. There was no evidence of diverticular                            bleeding.                           Internal hemorrhoids were found during                            retroflexion. The hemorrhoids were small and Grade                            I (internal hemorrhoids that do not prolapse).                           The exam was otherwise without abnormality on                            direct and retroflexion views. Complications:            No immediate complications. Estimated blood loss:                             None. Estimated Blood Loss:     Estimated blood loss: none. Impression:               - One 6 mm polyp in the transverse colon, removed                            with a cold snare. Resected and retrieved.                           - Mild diverticulosis in the right colon.                           - Moderate diverticulosis in the left colon.                           - Internal hemorrhoids.                           -  The examination was otherwise normal on direct                            and retroflexion views. Recommendation:           - Repeat colonoscopy after studies are complete for                            surveillance based on pathology results.                           - Patient has a contact number available for                            emergencies. The signs and symptoms of potential                            delayed complications were discussed with the                            patient. Return to normal activities tomorrow.                            Written discharge instructions were provided to the                            patient.                           - High fiber diet.                           - Continue present medications.                           - Await pathology results. Ladene Artist, MD 12/05/2020 3:46:29 PM This report has been signed electronically.

## 2020-12-07 ENCOUNTER — Other Ambulatory Visit: Payer: Self-pay | Admitting: Family Medicine

## 2020-12-08 NOTE — Telephone Encounter (Signed)
Last office visit 08/05/2020 for lump of right breast.  Last refilled 08/05/2020 for #90 with no refills.  No future appointments.

## 2020-12-08 NOTE — Telephone Encounter (Signed)
Need lab appt to check liver function before further refills.. refill denied.

## 2020-12-09 ENCOUNTER — Telehealth: Payer: Self-pay | Admitting: *Deleted

## 2020-12-09 NOTE — Progress Notes (Signed)
Cardiology Office Note:    Date:  12/09/2020   ID:  Lucas Willis, DOB [birth year], MRN [id number]  PCP:  Jinny Sanders, MD  Cardiologist:  Jenne Campus, MD    Referring MD: Jinny Sanders, MD   No chief complaint on file. I am doing fine  History of Present Illness:    Lucas Willis is a 65 y.o. male with past medical history significant for nonischemic cardiomyopathy initially diagnosed in 2017 when he was found to have severely diminished left ventricular ejection fraction, cardiac catheterization at that time showed no significant coronary artery disease.  He was put up on appropriate guideline directed medical therapy which resulted in improvement left ventricle ejection fraction.  He goes to gym on the regular basis he denies having any chest pain tightness squeezing pressure burning chest.   Past Medical History:  Diagnosis Date   CAD, nonobstructive on 2017 cath 07/19/2017   Cardiomyopathy ([place]) 76/16/0737   Chronic systolic heart failure ([place]) 09/04/2015   Fatigue 07/19/2017   Heart failure with reduced ejection fraction ([place]) 09/04/2015   Followed by Dr. [facility]. EF 43% on ECHO 07/19/2016  Repeat 2021:  EF40-46%   Leg cramps 05/14/2014   Osteoarthritis of left knee 04/05/2018   Pure hypercholesterolemia 06/04/2014    Past Surgical History:  Procedure Laterality Date   CARDIAC CATHETERIZATION N/A 09/11/2015   Procedure: Right/Left Heart Cath and Coronary Angiography;  Surgeon: Peter M Martinique, MD;  Location: [facility];  Service: Cardiovascular;  Laterality: N/A;   COLONOSCOPY  2011   MS-F/V-movi(good)-TA-recall 77yrs   CYST REMOVAL LEG Left 1970   back of L knee   HERNIA REPAIR Left 1979   inguinal   KNEE ARTHROSCOPY Left 2021   PLANTAR'S WART EXCISION  1972   POLYPECTOMY  2011   TA   SHOULDER SURGERY Right 2008   Dislocation Repair   TONSILLECTOMY     WISDOM TOOTH EXTRACTION      Current Medications: Current Meds  Medication Sig    Ascorbic Acid (VITAMIN C PO) Take 2,000 mg by mouth daily.   ASPIRIN LOW DOSE 81 MG EC tablet TAKE 1 TABLET BY MOUTH EVERY DAY   atorvastatin (LIPITOR) 40 MG tablet TAKE 1 TABLET BY MOUTH EVERY DAY   b complex vitamins tablet Take 1 tablet by mouth daily. Unknown strength   BIOTIN PO Take 1 capsule by mouth daily. Unknown strength   cetirizine (ZYRTEC) 10 MG tablet Take 10 mg by mouth daily.   Cholecalciferol 25 MCG (1000 UT) tablet Take 2,000 Units by mouth daily.   Chondroitin Sulfate-Vit C-Mn (CHONDROITIN SULFATE COMPLEX PO) Take 2 tablets by mouth daily. Unknown strength   ENTRESTO 49-51 MG TAKE 1 TABLET BY MOUTH TWICE A DAY   MAGNESIUM PO Take 1 tablet by mouth daily. Unknown strength   Multiple Vitamins-Minerals (CENTRUM SILVER ULTRA MENS PO) Take 1 tablet by mouth daily. Unknown strength   nitroGLYCERIN (NITROSTAT) 0.4 MG SL tablet Place 0.4 mg under the tongue every 5 (five) minutes as needed for chest pain. (Patient not taking: Reported on 12/05/2020)   spironolactone (ALDACTONE) 25 MG tablet TAKE 1 TABLET BY MOUTH EVERY DAY   terbinafine (LAMISIL) 250 MG tablet Take 1 tablet (250 mg total) by mouth daily.   [DISCONTINUED] carvedilol (COREG) 6.25 MG tablet TAKE 1 TABLET BY MOUTH TWICE A DAY WITH MEALS (Patient taking differently: Take 6.25 mg by mouth 2 (two) times daily with a meal.)     Allergies:  Demerol [meperidine] and Sulfonamide derivatives   Social History   Socioeconomic History   Marital status: Married    Spouse name: [name]   Number of children: 2   Years of education: Not on file   Highest education level: Not on file  Occupational History   Occupation: Art therapist  Tobacco Use   Smoking status: Never   Smokeless tobacco: Never  Vaping Use   Vaping Use: Never used  Substance and Sexual Activity   Alcohol use: Yes    Comment: rarely   Drug use: No   Sexual activity: Yes  Other Topics Concern   Not on file  Social History Narrative   2 kids,  65 and 84 year old.    Married   Social Determinants of Radio broadcast assistant Strain: Not on file  Food Insecurity: Not on file  Transportation Needs: Not on file  Physical Activity: Not on file  Stress: Not on file  Social Connections: Not on file     Family History: The patient's family history includes Heart failure in his mother. There is no history of Colon polyps, Colon cancer, Esophageal cancer, Stomach cancer, or Rectal cancer. ROS:   Please see the history of present illness.    All 14 point review of systems negative except as described per history of present illness  EKGs/Labs/Other Studies Reviewed:      Recent Labs: 08/05/2020: ALT 25; BUN 22; Creatinine, Ser 0.89; Hemoglobin 14.1; Platelets 243.0; Potassium 4.2; Sodium 140; TSH 2.07  Recent Lipid Panel    Component Value Date/Time   CHOL 147 08/05/2020 1708   CHOL 155 07/23/2019 0902   TRIG 161.0 (H) 08/05/2020 1708   TRIG 258 (HH) 06/08/2006 1002   HDL 43.20 08/05/2020 1708   HDL 42 07/23/2019 0902   CHOLHDL 3 08/05/2020 1708   VLDL 32.2 08/05/2020 1708   LDLCALC 72 08/05/2020 1708   LDLCALC 82 07/23/2019 0902   LDLCALC 89 04/11/2018 0734   LDLDIRECT 142.0 09/10/2009 1556    Physical Exam:    VS:  BP 124/80 (BP Location: Right Arm, Patient Position: Sitting)   Pulse 61   Ht 5\' 10"  (1.778 m)   Wt 208 lb (94.3 kg)   SpO2 96%   BMI 29.84 kg/m     Wt Readings from Last 3 Encounters:  12/05/20 195 lb (88.5 kg)  11/25/20 205 lb 6.4 oz (93.2 kg)  11/13/20 195 lb (88.5 kg)     GEN:  Well nourished, well developed in no acute distress HEENT: Normal NECK: No JVD; No carotid bruits LYMPHATICS: No lymphadenopathy CARDIAC: RRR, no murmurs, no rubs, no gallops RESPIRATORY:  Clear to auscultation without rales, wheezing or rhonchi  ABDOMEN: Soft, non-tender, non-distended MUSCULOSKELETAL:  No edema; No deformity  SKIN: Warm and dry LOWER EXTREMITIES: no swelling NEUROLOGIC:  Alert and oriented x  3 PSYCHIATRIC:  Normal affect   ASSESSMENT:    1. Heart failure with reduced ejection fraction ([place])   2. Dilated cardiomyopathy ([place])   3. 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 Pure hypercholesterolemia   5. Syncope, unspecified syncope type   6. Chronic systolic heart failure ([facility])    PLAN:    In order of problems listed above:  Heart failure with reduced left ventricle ejection fraction, will repeat echocardiogram to recheck left ventricle ejection fraction.  He seems to be compensated he is New York Heart Association class II Dilated cardiomyopathy, echocardiogram will be repeated CAD which is only nonobstructive  risk factors modifications Dyslipidemia: He is on high intensity statin form of Lipitor 40 which I will continue I did review his last cholesterol from August 05, 2020 showing LDL of 72 and HDL of 43.  He is triglycerides were mildly elevated 161. Dizziness and syncope: We will schedule him to have Zio patch.   Medication Adjustments/Labs and Tests Ordered: Current medicines are reviewed at length with the patient today.  Concerns regarding medicines are outlined above.  Orders Placed This Encounter  Procedures   LONG TERM MONITOR (3-14 DAYS)   EKG 12-Lead   Medication changes: No orders of the defined types were placed in this encounter.   Signed, Park Liter, MD, [facility] 12/09/2020 9:50 AM    [place]

## 2020-12-09 NOTE — Telephone Encounter (Signed)
Have you developed a fever since your procedure? no  2.   Have you had an respiratory symptoms (SOB or cough) since your procedure?no 3.   Have you tested positive for COVID 19 since your procedure no  4.   Have you had any family members/close contacts diagnosed with the COVID 19 since your procedure?  no   If yes to any of these questions please route to Joylene John, RN and Joella Prince, RN  Follow up Call-  Call back number 12/05/2020  Post procedure Call Back phone  # [phone]  Permission to leave phone message Yes  Some recent data might be hidden     Patient questions:  Do you have a fever, pain , or abdominal swelling? No. Pain Score  0 *  Have you tolerated food without any problems? Yes.    Have you been able to return to your normal activities? Yes.    Do you have any questions about your discharge instructions: Diet   No. Medications  No. Follow up visit  No.  Do you have questions or concerns about your Care? No.  Actions: * If pain score is 4 or above: No action needed, pain <4.

## 2020-12-15 ENCOUNTER — Encounter: Payer: Self-pay | Admitting: Gastroenterology

## 2021-02-06 ENCOUNTER — Other Ambulatory Visit: Payer: Self-pay | Admitting: Cardiology

## 2021-02-16 ENCOUNTER — Telehealth: Payer: Self-pay | Admitting: Cardiology

## 2021-02-16 ENCOUNTER — Ambulatory Visit
Admission: RE | Admit: 2021-02-16 | Discharge: 2021-02-16 | Disposition: A | Discharge status: Home / Self Care | Payer: BC Managed Care – PPO | Source: Ambulatory Visit | Attending: Emergency Medicine | Admitting: Emergency Medicine

## 2021-02-16 ENCOUNTER — Telehealth: Payer: Self-pay | Admitting: Family Medicine

## 2021-02-16 ENCOUNTER — Other Ambulatory Visit: Payer: Self-pay

## 2021-02-16 VITALS — BP 102/66 | HR 68 | Temp 100.3°F | Resp 19

## 2021-02-16 DIAGNOSIS — I5022 Chronic systolic (congestive) heart failure: Secondary | ICD-10-CM | POA: Diagnosis not present

## 2021-02-16 DIAGNOSIS — I255 Ischemic cardiomyopathy: Secondary | ICD-10-CM

## 2021-02-16 DIAGNOSIS — U071 COVID-19: Secondary | ICD-10-CM | POA: Diagnosis not present

## 2021-02-16 DIAGNOSIS — I2510 Atherosclerotic heart disease of native coronary artery without angina pectoris: Secondary | ICD-10-CM

## 2021-02-16 DIAGNOSIS — I5020 Unspecified systolic (congestive) heart failure: Secondary | ICD-10-CM

## 2021-02-16 MED ORDER — PAXLOVID (300/100) 20 X 150 MG & 10 X 100MG PO TBPK: ORAL_TABLET | ORAL | 0 refills | Status: DC

## 2021-02-16 NOTE — ED Provider Notes (Addendum)
[facility]    CSN: [id number] Arrival date & time: 02/16/21  1246      History   Chief Complaint Chief Complaint  Patient presents with   Sore Throat    HPI Lucas Willis is a 65 y.o. male.   Pt states he has Covid (at home Covid test was positive yesterday). Saturday he began having sore throat, not able to keep fluids down, states he has heard crackles (respiratory assessment) low grade fever and chills.    Sore Throat   Past Medical History:  Diagnosis Date   CAD, nonobstructive on 2017 cath 07/19/2017   Cardiomyopathy ([place]) 60/02/9322   Chronic systolic heart failure ([place]) 09/04/2015   Fatigue 07/19/2017   Heart failure with reduced ejection fraction ([place]) 09/04/2015   Followed by Dr. Agustin [facility]. EF 43% on ECHO 07/19/2016  Repeat 2021:  EF40-46%   Leg cramps 05/14/2014   Osteoarthritis of left knee 04/05/2018   Pure hypercholesterolemia 06/04/2014    Patient Active Problem List   Diagnosis Date Noted   Lump of right breast 09/26/2020   Osteoarthritis of left knee 04/05/2018   CAD, nonobstructive on 2017 cath 07/19/2017   Fatigue 07/19/2017   Cardiomyopathy ([place]) 09/04/2015   Heart failure with reduced ejection fraction ([place]) 55/73/2202   Chronic systolic heart failure ([place]) 09/04/2015   Pure hypercholesterolemia 06/04/2014   Leg cramps 05/14/2014    Past Surgical History:  Procedure Laterality Date   CARDIAC CATHETERIZATION N/A 09/11/2015   Procedure: Right/Left Heart Cath and Coronary Angiography;  Surgeon: Peter M Martinique, MD;  Location: [facility];  Service: Cardiovascular;  Laterality: N/A;   COLONOSCOPY  2011   MS-F/V-movi(good)-TA-recall 73yrs   CYST REMOVAL LEG Left 1970   back of L knee   HERNIA REPAIR Left 1979   inguinal   KNEE ARTHROSCOPY Left 2021   PLANTAR'S WART EXCISION  1972   POLYPECTOMY  2011   TA   SHOULDER SURGERY Right 2008   Dislocation Repair   TONSILLECTOMY     WISDOM TOOTH EXTRACTION          Home Medications    Prior to Admission medications   Medication Sig Start Date End Date Taking? Authorizing Provider  nirmatrelvir & ritonavir (PAXLOVID, 300/100,) 20 x 150 MG & 10 x 100MG  TBPK Take 1 dose twice daily for 5 days 02/16/21  Yes Lynden Oxford Scales, PA-C  Ascorbic Acid (VITAMIN C PO) Take 2,000 mg by mouth daily.    [provider]  ASPIRIN LOW DOSE 81 MG EC tablet TAKE 1 TABLET BY MOUTH EVERY DAY 07/18/20   Park Liter, MD  atorvastatin (LIPITOR) 40 MG tablet TAKE 1 TABLET BY MOUTH EVERY DAY 07/14/20   Park Liter, MD  b complex vitamins tablet Take 1 tablet by mouth daily. Unknown strength    [provider]  BIOTIN PO Take 1 capsule by mouth daily. Unknown strength    [provider]  carvedilol (COREG) 25 MG tablet Take 25 mg by mouth 2 (two) times daily with a meal.    [provider]  cetirizine (ZYRTEC) 10 MG tablet Take 10 mg by mouth daily.    [provider]  Cholecalciferol 25 MCG (1000 UT) tablet Take 2,000 Units by mouth daily.    [provider]  Chondroitin Sulfate-Vit C-Mn (CHONDROITIN SULFATE COMPLEX PO) Take 2 tablets by mouth daily. Unknown strength    [provider]  ENTRESTO 49-51 MG TAKE 1 TABLET BY MOUTH  TWICE A DAY 02/06/21   Park Liter, MD  MAGNESIUM PO Take 1 tablet by mouth daily. Unknown strength    [provider]  Multiple Vitamins-Minerals (CENTRUM SILVER ULTRA MENS PO) Take 1 tablet by mouth daily. Unknown strength    [provider]  nitroGLYCERIN (NITROSTAT) 0.4 MG SL tablet Place 0.4 mg under the tongue every 5 (five) minutes as needed for chest pain. Patient not taking: Reported on 12/05/2020    [provider]  spironolactone (ALDACTONE) 25 MG tablet TAKE 1 TABLET BY MOUTH EVERY DAY 07/25/20   Park Liter, MD  terbinafine (LAMISIL) 250 MG tablet Take 1 tablet (250 mg total) by mouth daily. 08/05/20   Jinny Sanders, MD     Family History Family History  Problem Relation Age of Onset   Heart failure Mother    Colon polyps Neg Hx    Colon cancer Neg Hx    Esophageal cancer Neg Hx    Stomach cancer Neg Hx    Rectal cancer Neg Hx     Social History Social History   Tobacco Use   Smoking status: Never   Smokeless tobacco: Never  Vaping Use   Vaping Use: Never used  Substance Use Topics   Alcohol use: Yes    Comment: rarely   Drug use: No     Allergies   Demerol [meperidine] and Sulfonamide derivatives   Review of Systems Review of Systems Per HPI  Physical Exam Triage Vital Signs ED Triage Vitals  Enc Vitals Group     BP 02/16/21 1353 102/66     Pulse Rate 02/16/21 1353 68     Resp 02/16/21 1353 19     Temp 02/16/21 1353 100.3 F (37.9 C)     Temp Source 02/16/21 1353 Oral     SpO2 02/16/21 1353 98 %     Weight --      Height --      Head Circumference --      Peak Flow --      Pain Score 02/16/21 1351 8     Pain Loc --      Pain Edu? --      Excl. in [place]? --    No data found.  Updated Vital Signs BP 102/66 (BP Location: Right Arm)   Pulse 68   Temp 100.3 F (37.9 C) (Oral)   Resp 19   SpO2 98%   Visual Acuity Right Eye Distance:   Left Eye Distance:   Bilateral Distance:    Right Eye Near:   Left Eye Near:    Bilateral Near:     Physical Exam Vitals and nursing note reviewed.  Constitutional:      Comments: Physical exam was declined today due to patient's known positive COVID-19 status and stable vital signs.  Patient was mentating well during visit.  Patient verbalized agreement with deferment.     UC Treatments / Results  Labs (all labs ordered are listed, but only abnormal results are displayed) Labs Reviewed - No data to display  EKG   Radiology No results found.  Procedures Procedures (including critical care time)  Medications Ordered in UC Medications - No data to display  Initial Impression / Assessment and Plan / UC Course  I  have reviewed the triage vital signs and the nursing notes.  Pertinent labs & imaging results that were available during my care of the patient were reviewed by me and considered in my medical decision making (  see chart for details).     Patient was prescribed Paxlovid; it has been less than 24 hours since his positive diagnosis of COVID.  Patient's most recent metabolic panel was done March 2022, it revealed normal kidney function, patient has historically had normal kidney function over the past year.  Patient was agreeable to taking Paxlovid for 2 to 3 days before going to emergency room to be evaluated for possible monoclonal antibody treatment, patient stated that he would rather not go to the emergency room now, wait for many hours then be denied treatment.  As such he opted for Paxlovid.   Final Clinical Impressions(s) / UC Diagnoses   Final diagnoses:  COVID-19  Cardiomyopathy, ischemic  CAD, nonobstructive on 2017 cath  Chronic systolic heart failure ([place])  Heart failure with reduced ejection fraction [facility])     Discharge Instructions      As we agreed, I have sent a prescription for Paxlovid of it to your pharmacy.  Please take 1 dose twice daily for the next 5 days.  In the next 2 to 3 days, if you do not see improvement of your symptoms, please go to the emergency room for evaluation for receiving monoclonal antibody treatment.  In the meantime, please be sure to quarantine at home.  Please ask a friend or family member to pick up your prescription for Paxlovid.     ED Prescriptions     Medication Sig Dispense Auth. Provider   nirmatrelvir & ritonavir (PAXLOVID, 300/100,) 20 x 150 MG & 10 x 100MG  TBPK Take 1 dose twice daily for 5 days 1 each Lynden Oxford Scales, PA-C      PDMP not reviewed this encounter.   Lynden Oxford Scales, PA-C 02/16/21 1426    Lynden Oxford Scales, PA-C 02/16/21 1428

## 2021-02-16 NOTE — Telephone Encounter (Signed)
I spoke with Lucas Willis; + covid home test on 02/15/21; started on 02/14/21 with symptoms of S/T very sore when swallows, chills, low grade fever, prod cough with white phlegm, watery diarrhea; last 12 hours had 6 watery stools; diarrhea started on 02/15/21 and was up several times during night with watery diarrhea.No H/A, no SOB,no vomiting, no dry mouth, abd pain, and no dizziness. Lucas Willis said he is drinking and can hear the gurgling sound all the way thru intestines and then has to have watery diarrhea. Some nasal congestion on and off. Lucas Willis said in no distress at this time.Lucas Willis has not had any covid vaccines; Lucas Willis has cardiomyopathy and CHF and is interested in antiviral covid med or treatment. Lucas Willis prefers not to go to ED and I scheduled [facility]t [facility]16/21 at 1 pm. Lucas Willis understands that UC may advise Lucas Willis to go to ED and Lucas Willis voiced understanding.Self quarantine, drink plenty of fluids, rest, and take Tylenol for fever. UC & ED precautions given and Lucas Willis voiced understanding. Sending note to Dr Diona Browner and Dr Diona Browner is out of office but her in box is being monitored; also sending note to [facility] CMA. Will also teams Butch Penny.

## 2021-02-16 NOTE — Telephone Encounter (Signed)
Spoke to the patient just now and let him know that he will need to call his PCP or go to urgent care to get this sent in.  He verbalizes understanding.    Encouraged patient to call back with any questions or concerns.

## 2021-02-16 NOTE — Telephone Encounter (Signed)
Pt called in stated he tested positive for Covid and is not Vaccinated symptoms  Chills ,headache,cough,weakness .would like a call back Please Advise [phone]

## 2021-02-16 NOTE — Discharge Instructions (Addendum)
As we agreed, I have sent a prescription for Paxlovid of it to your pharmacy.  Please take 1 dose twice daily for the next 5 days.  In the next 2 to 3 days, if you do not see improvement of your symptoms, please go to the emergency room for evaluation for receiving monoclonal antibody treatment.  In the meantime, please be sure to quarantine at home.  Please ask a friend or family member to pick up your prescription for Paxlovid.

## 2021-02-16 NOTE — ED Triage Notes (Signed)
Pt states he has Covid (at home Covid test was positive yesterday). Saturday he began having sore throat, not able to keep fluids down, states he has heard crackles (respiratory assessment) low grade fever and chills.

## 2021-02-16 NOTE — Telephone Encounter (Signed)
Patient tested positive for COVID 02/15/21. The patient has not been vaccinated and would like Dr. Raliegh Ip to write him an rx for the [place]. Please send rx to pharmacy in the chart

## 2021-02-16 NOTE — Telephone Encounter (Signed)
Noted  

## 2021-02-20 ENCOUNTER — Telehealth: Payer: Self-pay | Admitting: Family Medicine

## 2021-02-20 ENCOUNTER — Other Ambulatory Visit: Payer: Self-pay | Admitting: Family Medicine

## 2021-02-20 DIAGNOSIS — R110 Nausea: Secondary | ICD-10-CM | POA: Diagnosis not present

## 2021-02-20 DIAGNOSIS — U071 COVID-19: Secondary | ICD-10-CM | POA: Diagnosis not present

## 2021-02-20 DIAGNOSIS — E860 Dehydration: Secondary | ICD-10-CM | POA: Diagnosis not present

## 2021-02-20 DIAGNOSIS — I959 Hypotension, unspecified: Secondary | ICD-10-CM | POA: Diagnosis not present

## 2021-02-20 MED ORDER — PROMETHAZINE-DM 6.25-15 MG/5ML PO SYRP: 5.0000 mL | ORAL_SOLUTION | Freq: Every evening | ORAL | 0 refills | Status: DC | PRN

## 2021-02-20 NOTE — Telephone Encounter (Signed)
I spoke with pt; pt seen [facility] on [facility]/16/21. Pt said + covid on 02/15/21; pt started paxlovid since 02/16/21; liquid is going straight thru body. Pt drinking but feels like within 30' liquids goes out of body. During the night pt was on commode; got lightheaded and wound up on floor when stood up; did not lose consciousness.pt called EMS and BP difference 20 points sitting and standing. Sit BP was 112/93 and stand up was 20 pts different but pt did not know BP value.EMS gave Pt 500 ml saline and pt felt better and BP stabilized. In past 12 hrs watery diarrhea x 6 at least. Pt said this wk has lost 8 lbs. Now no abd,pain, no lightheaded but does have dry mouth. No CP or SOB. Pt continues with prod cough with white phlegm at night and not a lot of coughing during the day. Again pt has had watery diarrhea all wk; pt not eating due to extreme S/T this wk; now S/t is subsiding. Temp 100 during the first of wk. No fever in last 2 days. Since pt had EMS come out and got fluid last night pt does not think he needs to go to ED; pt wants med for diarrhea and also to stop cough during the night so pt can rest. Pt will finish paxlovid today. Pt request cb after review by Dr Diona Browner. [facility] [address]. ED precautions given and pt voiced understanding. Sending note to Dr Diona Browner and Butch Penny CMA; will teams Butch Penny also.Marland Kitchen

## 2021-02-20 NOTE — Telephone Encounter (Signed)
Pt called stating that he needed Diurea meds called in.

## 2021-02-20 NOTE — Telephone Encounter (Signed)
Lucas Willis notified as instructed by telephone.  He will try the imodium for the diarrhea.  He would like a cough suppressant for night sent to [facility] [address].

## 2021-02-20 NOTE — Telephone Encounter (Signed)
See Phone encounter for 02/16/2021.

## 2021-02-20 NOTE — Telephone Encounter (Signed)
Please call and get more information from patient.

## 2021-02-20 NOTE — Telephone Encounter (Signed)
Pt called stating that a triage nurse took information from him about dehydration from covid, and that Dr Diona Browner to get back to him and he have not heard anything

## 2021-02-20 NOTE — Progress Notes (Signed)
Already sent it!

## 2021-03-18 ENCOUNTER — Encounter: Payer: Self-pay | Admitting: Cardiology

## 2021-03-18 ENCOUNTER — Ambulatory Visit: Payer: BC Managed Care – PPO | Admitting: Cardiology

## 2021-03-18 ENCOUNTER — Other Ambulatory Visit: Payer: Self-pay

## 2021-03-18 VITALS — BP 108/68 | HR 71 | Ht 69.5 in | Wt 202.0 lb

## 2021-03-18 DIAGNOSIS — I420 Dilated cardiomyopathy: Secondary | ICD-10-CM

## 2021-03-18 DIAGNOSIS — I5020 Unspecified systolic (congestive) heart failure: Secondary | ICD-10-CM

## 2021-03-18 DIAGNOSIS — I2510 Atherosclerotic heart disease of native coronary artery without angina pectoris: Secondary | ICD-10-CM

## 2021-03-18 DIAGNOSIS — U071 COVID-19: Secondary | ICD-10-CM | POA: Insufficient documentation

## 2021-03-18 DIAGNOSIS — E7800 Pure hypercholesterolemia, unspecified: Secondary | ICD-10-CM

## 2021-03-18 DIAGNOSIS — I4729 Other ventricular tachycardia: Secondary | ICD-10-CM | POA: Insufficient documentation

## 2021-03-18 NOTE — Progress Notes (Signed)
Cardiology Office Note:    Date:  03/18/2021   ID:  Lucas Willis, DOB 1956-03-01, MRN [id number]  PCP:  Jinny Sanders, MD  Cardiologist:  Jenne Campus, MD    Referring MD: Jinny Sanders, MD   Chief Complaint  Patient presents with   Follow-up  I had a COVID but I am getting better now  History of Present Illness:    Lucas Willis is a 65 y.o. male with past medical history significant for nonischemic cardiomyopathy initially discovered in 2017.  Cardiac catheterization at the time showed nonobstructive disease.  With appropriate guideline directed medical therapy his left ventricle ejection fraction improved with last echocardiogram done in November 12, 2020 showing ejection fraction 45 to 50%.  He did have some atypical symptoms monitor has been placed he was find to have nonsustained ventricular tachycardia.  After that he was referred to EP he was felt to be well managed beta-blocker has been increased he denies having any dizziness passing out palpitations overall doing well.  In September he suffered from [place].  He was sick just for few days.  However after that he being weak tired and gradually build up his stamina.  He goes back to the gym on the regular basis have no difficulty doing it.  Denies have any chest pain tightness squeezing pressure burning chest.  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 Agustin Cree [facility]. EF 43% on ECHO 07/19/2016  Repeat 2021:  EF40-46%   Leg cramps 05/14/2014   Osteoarthritis of left knee 04/05/2018   Pure hypercholesterolemia 06/04/2014    Past Surgical History:  Procedure Laterality Date   CARDIAC CATHETERIZATION N/A 09/11/2015   Procedure:  Right/Left Heart Cath and Coronary Angiography;  Surgeon: Peter M Martinique, MD;  Location: [facility];  Service: Cardiovascular;  Laterality: N/A;   COLONOSCOPY  2011   MS-F/V-movi(good)-TA-recall 28yrs   CYST REMOVAL LEG Left 1970   back of L knee   HERNIA REPAIR Left 1979   inguinal   KNEE ARTHROSCOPY Left 2021   PLANTAR'S WART EXCISION  1972   POLYPECTOMY  2011   TA   SHOULDER SURGERY Right 2008   Dislocation Repair   TONSILLECTOMY     WISDOM TOOTH EXTRACTION      Current Medications: Current Meds  Medication Sig   Ascorbic Acid (VITAMIN C PO) Take 2,000 mg by mouth daily.   ASPIRIN LOW DOSE 81 MG EC tablet TAKE 1 TABLET BY MOUTH EVERY DAY (Patient taking differently: Take 81 mg by mouth daily.)   atorvastatin (LIPITOR) 40 MG tablet TAKE 1 TABLET BY MOUTH EVERY DAY (Patient taking differently: Take 40 mg by mouth daily.)   b complex vitamins tablet Take 1 tablet by mouth daily. Unknown strength   BIOTIN PO Take 1 capsule by mouth daily. Unknown strength   carvedilol (COREG) 25 MG tablet Take 25 mg by mouth 2 (two) times daily with a meal.   cetirizine (ZYRTEC) 10 MG tablet Take 10 mg by mouth daily.   Cholecalciferol 25 MCG (1000 UT) tablet Take 2,000 Units by mouth daily.   Chondroitin Sulfate-Vit C-Mn (CHONDROITIN SULFATE COMPLEX PO) Take 2 tablets by mouth daily. Unknown strength  ENTRESTO 49-51 MG TAKE 1 TABLET BY MOUTH TWICE A DAY (Patient taking differently: Take 1 tablet by mouth 2 (two) times daily.)   MAGNESIUM PO Take 1 tablet by mouth daily. Unknown strength   Multiple Vitamins-Minerals (CENTRUM SILVER ULTRA MENS PO) Take 1 tablet by mouth daily. Unknown strength   nitroGLYCERIN (NITROSTAT) 0.4 MG SL tablet Place 0.4 mg under the tongue every 5 (five) minutes as needed for chest pain.   spironolactone (ALDACTONE) 25 MG tablet TAKE 1 TABLET BY MOUTH EVERY DAY (Patient taking differently: Take 25 mg by mouth daily.)   terbinafine (LAMISIL) 250 MG tablet Take 1  tablet (250 mg total) by mouth daily.   [DISCONTINUED] promethazine-dextromethorphan (PROMETHAZINE-DM) 6.25-15 MG/5ML syrup Take 5 mLs by mouth at bedtime as needed for cough.     Allergies:   Demerol [meperidine] and Sulfonamide derivatives   Social History   Socioeconomic History   Marital status: Married    Spouse name: [name]   Number of children: 2   Years of education: Not on file   Highest education level: Not on file  Occupational History   Occupation: Art therapist  Tobacco Use   Smoking status: Never   Smokeless tobacco: Never  Vaping Use   Vaping Use: Never used  Substance and Sexual Activity   Alcohol use: Yes    Comment: rarely   Drug use: No   Sexual activity: Yes  Other Topics Concern   Not on file  Social History Narrative   2 kids, 10 and 67 year old.    Married   Social Determinants of Radio broadcast assistant Strain: Not on file  Food Insecurity: Not on file  Transportation Needs: Not on file  Physical Activity: Not on file  Stress: Not on file  Social Connections: Not on file     Family History: The patient's family history includes Heart failure in his mother. There is no history of Colon polyps, Colon cancer, Esophageal cancer, Stomach cancer, or Rectal cancer. ROS:   Please see the history of present illness.    All 14 point review of systems negative except as described per history of present illness  EKGs/Labs/Other Studies Reviewed:      Recent Labs: 08/05/2020: ALT 25; BUN 22; Creatinine, Ser 0.89; Hemoglobin 14.1; Platelets 243.0; Potassium 4.2; Sodium 140; TSH 2.07  Recent Lipid Panel    Component Value Date/Time   CHOL 147 08/05/2020 1708   CHOL 155 07/23/2019 0902   TRIG 161.0 (H) 08/05/2020 1708   TRIG 258 (HH) 06/08/2006 1002   HDL 43.20 08/05/2020 1708   HDL 42 07/23/2019 0902   CHOLHDL 3 08/05/2020 1708   VLDL 32.2 08/05/2020 1708   LDLCALC 72 08/05/2020 1708   LDLCALC 82 07/23/2019 0902   LDLCALC 89  04/11/2018 0734   LDLDIRECT 142.0 09/10/2009 1556    Physical Exam:    VS:  BP 108/68 (BP Location: Right Arm)   Pulse 71   Ht 5' 9.5" (1.765 m)   Wt 202 lb (91.6 kg)   SpO2 97%   BMI 29.40 kg/m     Wt Readings from Last 3 Encounters:  03/18/21 202 lb (91.6 kg)  12/05/20 195 lb (88.5 kg)  11/25/20 205 lb 6.4 oz (93.2 kg)     GEN:  Well nourished, well developed in no acute distress HEENT: Normal NECK: No JVD; No carotid bruits LYMPHATICS: No lymphadenopathy CARDIAC: RRR, no murmurs, no rubs, no gallops RESPIRATORY:  Clear to auscultation without rales, wheezing  or rhonchi  ABDOMEN: Soft, non-tender, non-distended MUSCULOSKELETAL:  No edema; No deformity  SKIN: Warm and dry LOWER EXTREMITIES: no swelling NEUROLOGIC:  Alert and oriented x 3 PSYCHIATRIC:  Normal affect   ASSESSMENT:    1. Dilated cardiomyopathy ([place])   2. Heart failure with reduced ejection fraction ([place])   3. 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 Pure hypercholesterolemia   5. Nonsustained ventricular tachycardia    PLAN:    In order of problems listed above:  Dilated cardiomyopathy on appropriate medication he is on beta-blocker he is on Entresto as well as Aldactone which I will continue.  His ejection fraction 45 to 50% we will continue present management. Heart failure: Compensated he is [place] Heart Association class II. Nonobstructive coronary artery disease. Dyslipidemia he is on Lipitor 40 I did review his K PN which show me LDL of 72 HDL 43 this is from 08/05/2020 which is good control. Nonsustained ventricular tachycardia denies having any symptoms now.  He was seen by EP team and I discussed this case with them.  Feeling is that there is not enough worrisome features to pursue any aggressive intervention right now.  His beta-blocker has been increased since that time he denies have any palpitation dizziness passing out.  We will continue that management   Medication Adjustments/Labs and  Tests Ordered: Current medicines are reviewed at length with the patient today.  Concerns regarding medicines are outlined above.  No orders of the defined types were placed in this encounter.  Medication changes: No orders of the defined types were placed in this encounter.   Signed, Park Liter, MD, [facility] 03/18/2021 8:47 AM    [place]

## 2021-03-18 NOTE — Patient Instructions (Signed)

## 2021-04-24 ENCOUNTER — Other Ambulatory Visit: Payer: Self-pay | Admitting: Cardiology

## 2021-04-28 ENCOUNTER — Telehealth: Payer: Self-pay | Admitting: Cardiology

## 2021-04-28 MED ORDER — CARVEDILOL 25 MG PO TABS: 25.0000 mg | ORAL_TABLET | Freq: Two times a day (BID) | ORAL | 3 refills | Status: DC

## 2021-04-28 NOTE — Telephone Encounter (Signed)
Refill sent in per request.  

## 2021-04-28 NOTE — Telephone Encounter (Signed)
*  STAT* If patient is at the pharmacy, call can be transferred to refill team.   1. Which medications need to be refilled? (please list name of each medication and dose if known)  carvedilol (COREG) 25 MG tablet  2. Which pharmacy/location (including street and city if local pharmacy) is medication to be sent to? [id number] [place], [place] - [address]  3. Do they need a 30 day or 90 day supply? 90 day supply

## 2021-05-04 ENCOUNTER — Telehealth: Payer: Self-pay | Admitting: Cardiology

## 2021-05-04 MED ORDER — CARVEDILOL 12.5 MG PO TABS: 12.5000 mg | ORAL_TABLET | Freq: Two times a day (BID) | ORAL | 1 refills | Status: DC

## 2021-05-04 NOTE — Addendum Note (Signed)
Addended by: Senaida Ores on: 05/04/2021 01:39 PM   Modules accepted: Orders

## 2021-05-04 NOTE — Telephone Encounter (Signed)
Pt c/o medication issue:  1. Name of Medication: carvedilol (COREG) 25 MG tablet  2. How are you currently taking this medication (dosage and times per day)?   3. Are you having a reaction (difficulty breathing--STAT)? NO  4. What is your medication issue? PT STATES THE MG FOR THIS MEDICINE SHOULD BE 12.5 MG AND NOT 25 MG

## 2021-05-04 NOTE — Telephone Encounter (Signed)
Spoke to patient. He has only ever taken carvedilol 12.5 mg twice daily. Unsure why 25 mg twice daily was sent in. Sent correct rx in after discussing with Dr. Agustin Cree.

## 2021-07-15 ENCOUNTER — Other Ambulatory Visit: Payer: Self-pay | Admitting: Cardiology

## 2021-07-15 NOTE — Telephone Encounter (Signed)
Aspirin low dose 81 mg # 90 x 3 refills sent to [phone]

## 2021-07-29 ENCOUNTER — Other Ambulatory Visit: Payer: Self-pay | Admitting: Cardiology

## 2021-09-16 ENCOUNTER — Other Ambulatory Visit: Payer: Self-pay | Admitting: Cardiology

## 2021-10-20 ENCOUNTER — Encounter: Payer: Self-pay | Admitting: Cardiology

## 2021-10-20 ENCOUNTER — Ambulatory Visit: Payer: BC Managed Care – PPO | Admitting: Cardiology

## 2021-10-20 VITALS — BP 98/60 | HR 73 | Ht 70.0 in | Wt 194.0 lb

## 2021-10-20 DIAGNOSIS — I5022 Chronic systolic (congestive) heart failure: Secondary | ICD-10-CM

## 2021-10-20 DIAGNOSIS — I2510 Atherosclerotic heart disease of native coronary artery without angina pectoris: Secondary | ICD-10-CM | POA: Diagnosis not present

## 2021-10-20 DIAGNOSIS — I5020 Unspecified systolic (congestive) heart failure: Secondary | ICD-10-CM

## 2021-10-20 DIAGNOSIS — I4729 Other ventricular tachycardia: Secondary | ICD-10-CM | POA: Diagnosis not present

## 2021-10-20 DIAGNOSIS — I739 Peripheral vascular disease, unspecified: Secondary | ICD-10-CM

## 2021-10-20 DIAGNOSIS — E785 Hyperlipidemia, unspecified: Secondary | ICD-10-CM | POA: Diagnosis not present

## 2021-10-20 NOTE — Progress Notes (Signed)
Cardiology Office Note:    Date:  10/20/2021   ID:  Lucas Willis, DOB Mar 21, 1956, MRN [id number]  PCP:  Jinny Sanders, MD  Cardiologist:  Jenne Campus, MD    Referring MD: Jinny Sanders, MD   Chief Complaint  Patient presents with   Follow-up  Doing very well  History of Present Illness:    Lucas Willis is a 66 y.o. male with past medical history significant for nonischemic cardiomyopathy, echocardiogram done last year in summer showed ejection fraction 45%, all guideline directed medical therapy, coronary artery disease, nonobstructive based on cardiac catheterization from 2017, dyslipidemia, leg cramps.  He comes today 2 months for follow-up.  Overall he is doing quite well he started exercising on the regular basis he lost significant mount of weight, he participated in mini triathlon and he did quite well.  Denies have any chest pain tightness squeezing pressure burning chest, interestingly he described to have some leg cramps.  That happen typically at evening time.  Past Medical History:  Diagnosis Date   CAD, nonobstructive on 2017 cath 07/19/2017   Cardiomyopathy ([place]) 37/02/6268   Chronic systolic heart failure ([place]) 09/04/2015   COVID    Fatigue 07/19/2017   Heart failure with reduced ejection fraction ([place]) 09/04/2015   Followed by Dr. Agustin Cree [facility]. EF 43% on ECHO 07/19/2016  Repeat 2021:  EF40-46%   Leg cramps 05/14/2014   Osteoarthritis of left knee 04/05/2018   Pure hypercholesterolemia 06/04/2014    Past Surgical History:  Procedure Laterality Date   CARDIAC CATHETERIZATION N/A 09/11/2015   Procedure: Right/Left Heart Cath and Coronary Angiography;  Surgeon: Peter M Martinique, MD;  Location: [facility];  Service: Cardiovascular;  Laterality: N/A;   COLONOSCOPY  2011   MS-F/V-movi(good)-TA-recall 54yr   CYST REMOVAL LEG Left 1970   back of L knee   HERNIA REPAIR Left 1979   inguinal   KNEE ARTHROSCOPY Left 2021   PLANTAR'S WART  EXCISION  1972   POLYPECTOMY  2011   TA   SHOULDER SURGERY Right 2008   Dislocation Repair   TONSILLECTOMY     WISDOM TOOTH EXTRACTION      Current Medications: Current Meds  Medication Sig   Ascorbic Acid (VITAMIN C PO) Take 1 tablet by mouth daily.   ASPIRIN LOW DOSE 81 MG EC tablet TAKE 1 TABLET BY MOUTH EVERY DAY (Patient taking differently: Take 81 mg by mouth daily.)   atorvastatin (LIPITOR) 40 MG tablet Take 1 tablet (40 mg total) by mouth daily.   b complex vitamins tablet Take 1 tablet by mouth daily. Unknown strength   BIOTIN PO Take 1 capsule by mouth daily. Unknown strength   carvedilol (COREG) 12.5 MG tablet Take 1 tablet (12.5 mg total) by mouth 2 (two) times daily with a meal.   cetirizine (ZYRTEC) 10 MG tablet Take 10 mg by mouth daily.   Cholecalciferol 25 MCG (1000 UT) tablet Take 2,000 Units by mouth daily.   Chondroitin Sulfate-Vit C-Mn (CHONDROITIN SULFATE COMPLEX PO) Take 2 tablets by mouth daily. Unknown strength   ENTRESTO 49-51 MG TAKE 1 TABLET BY MOUTH TWICE A DAY (Patient taking differently: Take 1 tablet by mouth 2 (two) times daily.)   MAGNESIUM PO Take 1 tablet by mouth daily. Unknown strength   Multiple Vitamins-Minerals (CENTRUM SILVER ULTRA MENS PO) Take 1 tablet by mouth daily. Unknown strength   nitroGLYCERIN (NITROSTAT) 0.4 MG SL tablet Place 0.4 mg under the tongue every 5 (five) minutes as  needed for chest pain.   spironolactone (ALDACTONE) 25 MG tablet Take 1 tablet (25 mg total) by mouth daily.   terbinafine (LAMISIL) 250 MG tablet Take 1 tablet (250 mg total) by mouth daily.     Allergies:   Demerol [meperidine] and Sulfonamide derivatives   Social History   Socioeconomic History   Marital status: Married    Spouse name: [name]   Number of children: 2   Years of education: Not on file   Highest education level: Not on file  Occupational History   Occupation: Art therapist  Tobacco Use   Smoking status: Never   Smokeless  tobacco: Never  Vaping Use   Vaping Use: Never used  Substance and Sexual Activity   Alcohol use: Yes    Comment: rarely   Drug use: No   Sexual activity: Yes  Other Topics Concern   Not on file  Social History Narrative   2 kids, 84 and 70 year old.    Married   Social Determinants of Radio broadcast assistant Strain: Not on file  Food Insecurity: Not on file  Transportation Needs: Not on file  Physical Activity: Not on file  Stress: Not on file  Social Connections: Not on file     Family History: The patient's family history includes Heart failure in his mother. There is no history of Colon polyps, Colon cancer, Esophageal cancer, Stomach cancer, or Rectal cancer. ROS:   Please see the history of present illness.    All 14 point review of systems negative except as described per history of present illness  EKGs/Labs/Other Studies Reviewed:      Recent Labs: No results found for requested labs within last 8760 hours.  Recent Lipid Panel    Component Value Date/Time   CHOL 147 08/05/2020 1708   CHOL 155 07/23/2019 0902   TRIG 161.0 (H) 08/05/2020 1708   TRIG 258 (HH) 06/08/2006 1002   HDL 43.20 08/05/2020 1708   HDL 42 07/23/2019 0902   CHOLHDL 3 08/05/2020 1708   VLDL 32.2 08/05/2020 1708   LDLCALC 72 08/05/2020 1708   LDLCALC 82 07/23/2019 0902   LDLCALC 89 04/11/2018 0734   LDLDIRECT 142.0 09/10/2009 1556    Physical Exam:    VS:  BP 98/60 (BP Location: Right Arm, Patient Position: Sitting)   Pulse 73   Ht '5\' 10"'$  (1.778 m)   Wt 194 lb (88 kg)   SpO2 96%   BMI 27.84 kg/m     Wt Readings from Last 3 Encounters:  10/20/21 194 lb (88 kg)  03/18/21 202 lb (91.6 kg)  12/05/20 195 lb (88.5 kg)     GEN:  Well nourished, well developed in no acute distress HEENT: Normal NECK: No JVD; No carotid bruits LYMPHATICS: No lymphadenopathy CARDIAC: RRR, no murmurs, no rubs, no gallops RESPIRATORY:  Clear to auscultation without rales, wheezing or rhonchi   ABDOMEN: Soft, non-tender, non-distended MUSCULOSKELETAL:  No edema; No deformity  SKIN: Warm and dry LOWER EXTREMITIES: no swelling NEUROLOGIC:  Alert and oriented x 3 PSYCHIATRIC:  Normal affect   ASSESSMENT:    1. CAD, nonobstructive on 2017 cath   2. Chronic systolic heart failure ([place])   3. Nonsustained ventricular tachycardia ([place])   4. Heart failure with reduced ejection fraction ([facility])    PLAN:    In order of problems listed above:  Coronary disease nonobstructive.  On appropriate guideline directed medical therapy, he is on antiplatelet therapy as well as statin we  will continue present management. Dyslipidemia I did review his K PN which only dated from 08/05/2020 with LDL of 72 HDL 43.  He is on Lipitor 40 which I will continue.  We will check his fasting lipid profile today. Cardiomyopathy with latest estimation done in June showing ejection fraction 45%.  Clinically is doing quite well I will repeat his echocardiogram to recheck left ventricle ejection fraction.  He is on guideline directed medical therapy which I will continue. Cramps visit difficult symptoms to figure out.  Interestingly on physical exam I do not feel good pulse on the left side.  I will ask him to have ABI done.  In the meantime I will also check complete metabolic panel be looking for calcium as well as potassium.   Medication Adjustments/Labs and Tests Ordered: Current medicines are reviewed at length with the patient today.  Concerns regarding medicines are outlined above.  No orders of the defined types were placed in this encounter.  Medication changes: No orders of the defined types were placed in this encounter.   Signed, Park Liter, MD, [facility] 10/20/2021 9:09 AM    [place]

## 2021-10-20 NOTE — Patient Instructions (Addendum)
Medication Instructions:  Your physician recommends that you continue on your current medications as directed. Please refer to the Current Medication list given to you today.  *If you need a refill on your cardiac medications before your next appointment, please call your pharmacy*   Lab Work:  Fasting Lipid, CMP Today [address] [place] [address] If you have labs (blood work) drawn today and your tests are completely normal, you will receive your results only by: [place] (if you have MyChart) OR A paper copy in the mail If you have any lab test that is abnormal or we need to change your treatment, we will call you to review the results.   Testing/Procedures: Your physician has requested that you have an echocardiogram. Echocardiography is a painless test that uses sound waves to create images of your heart. It provides your doctor with information about the size and shape of your heart and how well your heart's chambers and valves are working. This procedure takes approximately one hour. There are no restrictions for this procedure.   Your physician has requested that you have an ankle brachial index (ABI). During this test an ultrasound and blood pressure cuff are used to evaluate the arteries that supply the arms and legs with blood. Allow thirty minutes for this exam. There are no restrictions or special instructions.    Follow-Up: At [facility], you and your health needs are our priority.  As part of our continuing mission to provide you with exceptional heart care, we have created designated Provider Care Teams.  These Care Teams include your primary Cardiologist (physician) and Advanced Practice Providers (APPs -  Physician Assistants and Nurse Practitioners) who all work together to provide you with the care you need, when you need it.  We recommend signing up for the patient portal called "MyChart".  Sign up information is provided on this After Visit Summary.  MyChart is used to  connect with patients for Virtual Visits (Telemedicine).  Patients are able to view lab/test results, encounter notes, upcoming appointments, etc.  Non-urgent messages can be sent to your provider as well.   To learn more about what you can do with MyChart, go to [email or web address].    Your next appointment:   6 month(s)  The format for your next appointment:   In Person  Provider:   Jenne Campus, MD    Other Instructions NA

## 2021-10-21 LAB — COMPREHENSIVE METABOLIC PANEL
ALT: 24 IU/L (ref 0–44)
AST: 28 IU/L (ref 0–40)
Albumin/Globulin Ratio: 1.8 (ref 1.2–2.2)
Albumin: 4.8 g/dL (ref 3.8–4.8)
Alkaline Phosphatase: 94 IU/L (ref 44–121)
BUN/Creatinine Ratio: 21 (ref 10–24)
BUN: 21 mg/dL (ref 8–27)
Bilirubin Total: 0.4 mg/dL (ref 0.0–1.2)
CO2: 24 mmol/L (ref 20–29)
Calcium: 10 mg/dL (ref 8.6–10.2)
Chloride: 101 mmol/L (ref 96–106)
Creatinine, Ser: 0.98 mg/dL (ref 0.76–1.27)
Globulin, Total: 2.6 g/dL (ref 1.5–4.5)
Glucose: 101 mg/dL — ABNORMAL HIGH (ref 70–99)
Potassium: 4.4 mmol/L (ref 3.5–5.2)
Sodium: 138 mmol/L (ref 134–144)
Total Protein: 7.4 g/dL (ref 6.0–8.5)
eGFR: 86 mL/min/{1.73_m2} (ref >59)

## 2021-10-21 LAB — LIPID PANEL
Chol/HDL Ratio: 4.8 ratio (ref 0.0–5.0)
Cholesterol, Total: 207 mg/dL — ABNORMAL HIGH (ref 100–199)
HDL: 43 mg/dL (ref >39)
LDL Chol Calc (NIH): 134 mg/dL — ABNORMAL HIGH (ref 0–99)
Triglycerides: 166 mg/dL — ABNORMAL HIGH (ref 0–149)
VLDL Cholesterol Cal: 30 mg/dL (ref 5–40)

## 2021-10-23 ENCOUNTER — Telehealth: Payer: Self-pay

## 2021-10-23 DIAGNOSIS — E785 Hyperlipidemia, unspecified: Secondary | ICD-10-CM

## 2021-10-23 MED ORDER — ATORVASTATIN CALCIUM 80 MG PO TABS: 80.0000 mg | ORAL_TABLET | Freq: Every day | ORAL | 2 refills | Status: DC

## 2021-10-23 NOTE — Telephone Encounter (Signed)
Patient notified of results and recommendations and agreed with plan. Rx sent, lab order on file. He's aware to be fasting for blood work.

## 2021-10-23 NOTE — Telephone Encounter (Signed)
-----   Message from Park Liter, MD sent at 10/23/2021  9:09 AM EDT ----- Cholesterol still not good.  Please increase the dose of Lipitor to 80 mg daily, fasting lipid profile, AST LT 6 weeks

## 2021-11-03 ENCOUNTER — Other Ambulatory Visit (HOSPITAL_COMMUNITY): Payer: Self-pay | Admitting: Cardiology

## 2021-11-03 DIAGNOSIS — I739 Peripheral vascular disease, unspecified: Secondary | ICD-10-CM

## 2021-11-04 ENCOUNTER — Ambulatory Visit (HOSPITAL_BASED_OUTPATIENT_CLINIC_OR_DEPARTMENT_OTHER)
Admission: RE | Admit: 2021-11-04 | Discharge: 2021-11-04 | Disposition: A | Discharge status: Home / Self Care | Payer: BC Managed Care – PPO | Source: Ambulatory Visit | Attending: Cardiology | Admitting: Cardiology

## 2021-11-04 DIAGNOSIS — I5022 Chronic systolic (congestive) heart failure: Secondary | ICD-10-CM | POA: Insufficient documentation

## 2021-11-04 DIAGNOSIS — R0609 Other forms of dyspnea: Secondary | ICD-10-CM

## 2021-11-04 DIAGNOSIS — I5020 Unspecified systolic (congestive) heart failure: Secondary | ICD-10-CM | POA: Insufficient documentation

## 2021-11-04 LAB — ECHOCARDIOGRAM COMPLETE
AR max vel: 2.17 cm2
AV Area VTI: 2.16 cm2
AV Area mean vel: 2.09 cm2
AV Mean grad: 2 mmHg
AV Peak grad: 3.9 mmHg
Ao pk vel: 0.99 m/s
Area-P 1/2: 4.39 cm2
Calc EF: 38 %
S' Lateral: 3.8 cm
Single Plane A2C EF: 35.6 %
Single Plane A4C EF: 40.8 %

## 2021-11-04 NOTE — Progress Notes (Signed)
  Echocardiogram 2D Echocardiogram has been performed.  Lucas Willis F 11/04/2021, 8:43 AM

## 2021-11-05 ENCOUNTER — Inpatient Hospital Stay (HOSPITAL_COMMUNITY): Admission: RE | Admit: 2021-11-05 | Payer: BC Managed Care – PPO | Source: Ambulatory Visit

## 2021-11-17 ENCOUNTER — Ambulatory Visit (HOSPITAL_COMMUNITY)
Admission: RE | Admit: 2021-11-17 | Discharge: 2021-11-17 | Disposition: A | Discharge status: Home / Self Care | Payer: BC Managed Care – PPO | Source: Ambulatory Visit | Attending: Cardiovascular Disease | Admitting: Cardiovascular Disease

## 2021-11-17 DIAGNOSIS — I739 Peripheral vascular disease, unspecified: Secondary | ICD-10-CM | POA: Diagnosis not present

## 2021-11-23 ENCOUNTER — Other Ambulatory Visit: Payer: Self-pay | Admitting: Cardiology

## 2021-12-04 DIAGNOSIS — X32XXXD Exposure to sunlight, subsequent encounter: Secondary | ICD-10-CM | POA: Diagnosis not present

## 2021-12-04 DIAGNOSIS — L820 Inflamed seborrheic keratosis: Secondary | ICD-10-CM | POA: Diagnosis not present

## 2021-12-04 DIAGNOSIS — Z1283 Encounter for screening for malignant neoplasm of skin: Secondary | ICD-10-CM | POA: Diagnosis not present

## 2021-12-04 DIAGNOSIS — L570 Actinic keratosis: Secondary | ICD-10-CM | POA: Diagnosis not present

## 2021-12-04 DIAGNOSIS — D225 Melanocytic nevi of trunk: Secondary | ICD-10-CM | POA: Diagnosis not present

## 2022-02-01 ENCOUNTER — Other Ambulatory Visit: Payer: Self-pay | Admitting: Cardiology

## 2022-04-08 ENCOUNTER — Ambulatory Visit: Payer: BC Managed Care – PPO | Admitting: Family

## 2022-04-08 ENCOUNTER — Encounter: Payer: Self-pay | Admitting: Family

## 2022-04-08 VITALS — BP 102/70 | HR 77 | Temp 98.2°F | Resp 16 | Ht 70.0 in | Wt 201.1 lb

## 2022-04-08 DIAGNOSIS — J0110 Acute frontal sinusitis, unspecified: Secondary | ICD-10-CM | POA: Diagnosis not present

## 2022-04-08 MED ORDER — AMOXICILLIN-POT CLAVULANATE 875-125 MG PO TABS: 1.0000 | ORAL_TABLET | Freq: Two times a day (BID) | ORAL | 0 refills | Status: DC

## 2022-04-08 NOTE — Progress Notes (Signed)
Established Patient Office Visit  Subjective:  Patient ID: Lucas Willis, male    DOB: 1955-08-05  Age: 66 y.o. MRN: [id number]  CC:  Chief Complaint  Patient presents with   Sinusitis    X 7 days    HPI Lucas Willis is here today with concerns.   One week ago started with sinus type symptoms. C/o nasal congestion. No sore throat or ear pain. No fever or chills.   Otc mucinex with only mild relief.  Has not tested for covid.  C/o symptoms for the last seven days. Has improved slightly.   Past Medical History:  Diagnosis Date   CAD, nonobstructive on 2017 cath 07/19/2017   Cardiomyopathy ([place]) 34/28/7681   Chronic systolic heart failure ([place]) 09/04/2015   COVID    Fatigue 07/19/2017   Heart failure with reduced ejection fraction ([place]) 09/04/2015   Followed by Dr. Agust[facility]. EF 43% on ECHO 07/19/2016  Repeat 2021:  EF40-46%   Leg cramps 05/14/2014   Osteoarthritis of left knee 04/05/2018   Pure hypercholesterolemia 06/04/2014    Past Surgical History:  Procedure Laterality Date   CARDIAC CATHETERIZATION N/A 09/11/2015   Procedure: Right/Left Heart Cath and Coronary Angiography;  Surgeon: Peter M Martinique, MD;  Location: [facility];  Service: Cardiovascular;  Laterality: N/A;   COLONOSCOPY  2011   MS-F/V-movi(good)-TA-recall 27yr   CYST REMOVAL LEG Left 1970   back of L knee   HERNIA REPAIR Left 1979   inguinal   KNEE ARTHROSCOPY Left 2021   PLANTAR'S WART EXCISION  1972   POLYPECTOMY  2011   TA   SHOULDER SURGERY Right 2008   Dislocation Repair   TONSILLECTOMY     WISDOM TOOTH EXTRACTION      Family History  Problem Relation Age of Onset   Heart failure Mother    Colon polyps Neg Hx    Colon cancer Neg Hx    Esophageal cancer Neg Hx    Stomach cancer Neg Hx    Rectal cancer Neg Hx     Social History   Socioeconomic History   Marital status: Married    Spouse name: DQuita Skye  Number of children: 2   Years of education: Not on file    Highest education level: Not on file  Occupational History   Occupation: cArt therapist Tobacco Use   Smoking status: Never   Smokeless tobacco: Never  Vaping Use   Vaping Use: Never used  Substance and Sexual Activity   Alcohol use: Yes    Comment: rarely   Drug use: No   Sexual activity: Yes  Other Topics Concern   Not on file  Social History Narrative   2 kids, 367and 246year old.    Married   Social Determinants of HRadio broadcast assistantStrain: Not on file  Food Insecurity: Not on file  Transportation Needs: Not on file  Physical Activity: Not on file  Stress: Not on file  Social Connections: Not on file  Intimate Partner Violence: Not on file    Outpatient Medications Prior to Visit  Medication Sig Dispense Refill   Ascorbic Acid (VITAMIN C PO) Take 1 tablet by mouth daily.     ASPIRIN LOW DOSE 81 MG EC tablet TAKE 1 TABLET BY MOUTH EVERY DAY (Patient taking differently: Take 81 mg by mouth daily.) 90 tablet 3   atorvastatin (LIPITOR) 80 MG tablet Take 1 tablet (80 mg total) by mouth daily.  90 tablet 2   b complex vitamins tablet Take 1 tablet by mouth daily. Unknown strength     BIOTIN PO Take 1 capsule by mouth daily. Unknown strength     carvedilol (COREG) 12.5 MG tablet Take 1 tablet (12.5 mg total) by mouth 2 (two) times daily with a meal. 180 tablet 1   cetirizine (ZYRTEC) 10 MG tablet Take 10 mg by mouth daily.     Cholecalciferol 25 MCG (1000 UT) tablet Take 2,000 Units by mouth daily.     Chondroitin Sulfate-Vit C-Mn (CHONDROITIN SULFATE COMPLEX PO) Take 2 tablets by mouth daily. Unknown strength     ENTRESTO 49-51 MG TAKE 1 TABLET BY MOUTH TWICE A DAY 180 tablet 2   MAGNESIUM PO Take 1 tablet by mouth daily. Unknown strength     Menaquinone-7 (K2 PO) Take by mouth.     Multiple Vitamins-Minerals (CENTRUM SILVER ULTRA MENS PO) Take 1 tablet by mouth daily. Unknown strength     nitroGLYCERIN (NITROSTAT) 0.4 MG SL tablet Place 0.4 mg under  the tongue every 5 (five) minutes as needed for chest pain.     spironolactone (ALDACTONE) 25 MG tablet TAKE 1 TABLET (25 MG TOTAL) BY MOUTH DAILY. 90 tablet 1   terbinafine (LAMISIL) 250 MG tablet Take 1 tablet (250 mg total) by mouth daily. 90 tablet 0   No facility-administered medications prior to visit.    Allergies  Allergen Reactions   Demerol [Meperidine] Other (See Comments)    Face numb, hyperventilating, fingers went rigid   Sulfonamide Derivatives     REACTION: unspecified        Objective:    Physical Exam Constitutional:      General: He is awake. He is not in acute distress.    Appearance: Normal appearance. He is not ill-appearing.  HENT:     Right Ear: Tympanic membrane normal.     Left Ear: A middle ear effusion (clear) is present.     Nose:     Right Turbinates: Enlarged and swollen.     Left Turbinates: Enlarged and swollen.     Right Sinus: Frontal sinus tenderness present. No maxillary sinus tenderness.     Left Sinus: Frontal sinus tenderness present. No maxillary sinus tenderness.     Mouth/Throat:     Mouth: Mucous membranes are moist.     Pharynx: No pharyngeal swelling, oropharyngeal exudate or posterior oropharyngeal erythema.  Eyes:     Extraocular Movements: Extraocular movements intact.     Pupils: Pupils are equal, round, and reactive to light.  Cardiovascular:     Rate and Rhythm: Normal rate and regular rhythm.  Pulmonary:     Effort: Pulmonary effort is normal.     Breath sounds: Normal breath sounds. No wheezing.  Neurological:     Mental Status: He is alert.     BP 102/70   Pulse 77   Temp 98.2 F (36.8 C)   Resp 16   Ht _0  (1.778 m)   Wt 201 lb 2 oz (91.2 kg)   SpO2 94%   BMI 28.86 kg/m  Wt Readings from Last 3 Encounters:  04/08/22 201 lb 2 oz (91.2 kg)  10/20/21 194 lb (88 kg)  03/18/21 202 lb (91.6 kg)     Health Maintenance Due  Topic Date Due   COVID-19 Vaccine (1) Never done   HIV Screening  Never done    Zoster Vaccines- Shingrix (1 of 2) Never done   Pneumonia Vaccine 65+ Years  old (1 - PCV) Never done   INFLUENZA VACCINE  12/29/2021    There are no preventive care reminders to display for this patient.  Lab Results  Component Value Date   TSH 2.07 08/05/2020   Lab Results  Component Value Date   WBC 6.8 08/05/2020   HGB 14.1 08/05/2020   HCT 39.9 08/05/2020   MCV 96.5 08/05/2020   PLT 243.0 08/05/2020   Lab Results  Component Value Date   NA 138 10/20/2021   K 4.4 10/20/2021   CO2 24 10/20/2021   GLUCOSE 101 (H) 10/20/2021   BUN 21 10/20/2021   CREATININE 0.98 10/20/2021   BILITOT 0.4 10/20/2021   ALKPHOS 94 10/20/2021   AST 28 10/20/2021   ALT 24 10/20/2021   PROT 7.4 10/20/2021   ALBUMIN 4.8 10/20/2021   CALCIUM 10.0 10/20/2021   EGFR 86 10/20/2021   GFR 90.72 08/05/2020   No results found for: "HGBA1C"    Assessment & Plan:   Problem List Items Addressed This Visit       Respiratory   Acute non-recurrent frontal sinusitis - Primary    Prescription given for augmentin 875/125 mg po bid for ten days. Pt to continue tylenol/ibuprofen prn sinus pain. Continue with humidifier prn and steam showers recommended as well. instructed If no symptom improvement in 48 hours please f/u       Relevant Medications   amoxicillin-clavulanate (AUGMENTIN) 875-125 MG tablet    Meds ordered this encounter  Medications   amoxicillin-clavulanate (AUGMENTIN) 875-125 MG tablet    Sig: Take 1 tablet by mouth 2 (two) times daily.    Dispense:  20 tablet    Refill:  0    Order Specific Question:   Supervising Provider    Answer:   BEDSOLE, AMY E [[id number]]    Follow-up: No follow-ups on file.    Eugenia Pancoast, FNP

## 2022-04-09 NOTE — Assessment & Plan Note (Signed)
Prescription given for augmentin 875/125 mg po bid for ten days. Pt to continue tylenol/ibuprofen prn sinus pain. Continue with humidifier prn and steam showers recommended as well. instructed If no symptom improvement in 48 hours please f/u ? ?

## 2022-04-29 ENCOUNTER — Telehealth: Payer: Self-pay | Admitting: Family Medicine

## 2022-04-29 NOTE — Telephone Encounter (Signed)
Patient was seen by Lucas Willis and was prescribed amoxicillin-clavulanate (AUGMENTIN) 875-125 MG tablet. He stated he is still not feeling 100% and wanted to know if she would send in another round of the amoxicillin for him. He stated he was in [place] and the change of weather kind of affected him. He would like a call if this can or can't be done. Thank you!

## 2022-04-30 ENCOUNTER — Encounter: Payer: Self-pay | Admitting: Family

## 2022-04-30 NOTE — Telephone Encounter (Signed)
LVMTRC 

## 2022-04-30 NOTE — Telephone Encounter (Signed)
Pt called in to know status of RX refill . Please advise [phone]

## 2022-05-03 ENCOUNTER — Telehealth: Payer: Self-pay | Admitting: Family Medicine

## 2022-05-03 DIAGNOSIS — Z125 Encounter for screening for malignant neoplasm of prostate: Secondary | ICD-10-CM

## 2022-05-03 DIAGNOSIS — E7800 Pure hypercholesterolemia, unspecified: Secondary | ICD-10-CM

## 2022-05-03 NOTE — Telephone Encounter (Signed)
-----   Message from Velna Hatchet, RT sent at 04/19/2022 12:21 PM EST ----- Regarding: Fri 12/8 lab Patient is scheduled for cpx, please order future labs.  Thanks, Anda Kraft

## 2022-05-03 NOTE — Telephone Encounter (Signed)
Left message to return call to our office.  

## 2022-05-04 NOTE — Telephone Encounter (Signed)
Called pt and he stated that he was in [facility] the week before last. He wanted to know about the RX, but he feels like he is on the tail end of it now.

## 2022-05-04 NOTE — Telephone Encounter (Signed)
I would just give it more time.

## 2022-05-04 NOTE — Telephone Encounter (Signed)
Yes agreed, glad to hear we are on the tail end.

## 2022-05-04 NOTE — Telephone Encounter (Signed)
Patient called and was returning [place] call.

## 2022-05-07 ENCOUNTER — Other Ambulatory Visit (INDEPENDENT_AMBULATORY_CARE_PROVIDER_SITE_OTHER): Payer: BC Managed Care – PPO

## 2022-05-07 DIAGNOSIS — Z125 Encounter for screening for malignant neoplasm of prostate: Secondary | ICD-10-CM

## 2022-05-07 DIAGNOSIS — E7800 Pure hypercholesterolemia, unspecified: Secondary | ICD-10-CM

## 2022-05-07 LAB — COMPREHENSIVE METABOLIC PANEL
ALT: 29 U/L (ref 0–53)
AST: 25 U/L (ref 0–37)
Albumin: 4.1 g/dL (ref 3.5–5.2)
Alkaline Phosphatase: 65 U/L (ref 39–117)
BUN: 21 mg/dL (ref 6–23)
CO2: 26 mEq/L (ref 19–32)
Calcium: 9.2 mg/dL (ref 8.4–10.5)
Chloride: 105 mEq/L (ref 96–112)
Creatinine, Ser: 0.76 mg/dL (ref 0.40–1.50)
GFR: 93.99 mL/min (ref >60.00)
Glucose, Bld: 91 mg/dL (ref 70–99)
Potassium: 4 mEq/L (ref 3.5–5.1)
Sodium: 139 mEq/L (ref 135–145)
Total Bilirubin: 0.4 mg/dL (ref 0.2–1.2)
Total Protein: 6.8 g/dL (ref 6.0–8.3)

## 2022-05-07 LAB — LIPID PANEL
Cholesterol: 172 mg/dL (ref 0–200)
HDL: 33.7 mg/dL — ABNORMAL LOW (ref >39.00)
NonHDL: 138.26
Total CHOL/HDL Ratio: 5
Triglycerides: 384 mg/dL — ABNORMAL HIGH (ref 0.0–149.0)
VLDL: 76.8 mg/dL — ABNORMAL HIGH (ref 0.0–40.0)

## 2022-05-07 LAB — LDL CHOLESTEROL, DIRECT: Direct LDL: 91 mg/dL

## 2022-05-07 NOTE — Progress Notes (Signed)
No critical labs need to be addressed urgently. We will discuss labs in detail at upcoming office visit.   

## 2022-05-14 ENCOUNTER — Ambulatory Visit (INDEPENDENT_AMBULATORY_CARE_PROVIDER_SITE_OTHER): Payer: BC Managed Care – PPO | Admitting: Family Medicine

## 2022-05-14 ENCOUNTER — Encounter: Payer: Self-pay | Admitting: Family Medicine

## 2022-05-14 VITALS — BP 90/62 | HR 80 | Temp 98.9°F | Ht 68.5 in | Wt 199.4 lb

## 2022-05-14 DIAGNOSIS — I4729 Other ventricular tachycardia: Secondary | ICD-10-CM

## 2022-05-14 DIAGNOSIS — M720 Palmar fascial fibromatosis [Dupuytren]: Secondary | ICD-10-CM

## 2022-05-14 DIAGNOSIS — E7800 Pure hypercholesterolemia, unspecified: Secondary | ICD-10-CM | POA: Diagnosis not present

## 2022-05-14 DIAGNOSIS — L821 Other seborrheic keratosis: Secondary | ICD-10-CM

## 2022-05-14 DIAGNOSIS — Z0000 Encounter for general adult medical examination without abnormal findings: Secondary | ICD-10-CM

## 2022-05-14 DIAGNOSIS — Z23 Encounter for immunization: Secondary | ICD-10-CM | POA: Diagnosis not present

## 2022-05-14 DIAGNOSIS — I5020 Unspecified systolic (congestive) heart failure: Secondary | ICD-10-CM

## 2022-05-14 DIAGNOSIS — I420 Dilated cardiomyopathy: Secondary | ICD-10-CM

## 2022-05-14 DIAGNOSIS — Z125 Encounter for screening for malignant neoplasm of prostate: Secondary | ICD-10-CM

## 2022-05-14 HISTORY — DX: Other seborrheic keratosis: L82.1

## 2022-05-14 HISTORY — DX: Palmar fascial fibromatosis (dupuytren): M72.0

## 2022-05-14 LAB — PSA: PSA: 1 ng/mL (ref 0.10–4.00)

## 2022-05-14 NOTE — Assessment & Plan Note (Signed)
Chronic, worsening no interference with function at this point but I encouraged him to speak with the hand surgeon for possible treatment options.

## 2022-05-14 NOTE — Assessment & Plan Note (Addendum)
Followed by Dr. Army Melia cardiology.  Repeat echocardiogram in 2023 showed EF of 50 to 55% on Coreg, Entresto and spironolactone. He is euvolemic in office today

## 2022-05-14 NOTE — Assessment & Plan Note (Signed)
Chronic, followed by cardiology

## 2022-05-14 NOTE — Assessment & Plan Note (Signed)
Chronic, under right eye.  No concerning qualities.  Encouraged patient to keep up with annual dermatology exams given family history of melanoma.

## 2022-05-14 NOTE — Assessment & Plan Note (Signed)
On atorvastatin  80 mg daily LDL not at goal < 70. He will work on getting back on track with regular exercise and weight loss.  We discussed his diet in detail and ways to lower cholesterol.

## 2022-05-14 NOTE — Progress Notes (Signed)
Patient ID: Lucas Willis, male    DOB: 1956-03-20, 66 y.o.   MRN: [id number]  This visit was conducted in person.  BP 90/62   Pulse 80   Temp 98.9 F (37.2 C) (Oral)   Ht 5' 8.5" (1.74 m)   Wt 199 lb 6 oz (90.4 kg)   SpO2 96%   BMI 29.87 kg/m    CC:  Chief Complaint  Patient presents with   Annual Exam    Subjective:   HPI: Lucas Willis is a 66 y.o. male presenting on 05/14/2022 for Annual Exam  The patient presents for annual  complete physical and review of chronic health problems. He/She also has the following acute concerns today:  As continued contracture in right had.. gradually worsen,  does not yet interfere with hand movement.  2.  Daughter with history of melanoma... had yearly skin check at dermatology  Forgot to share new lesion below right eye present several years. No changing occ itching. No bleeding     Elevated Cholesterol:  On atorvastatin  80 mg daily LDL not at goal < 70. Lab Results  Component Value Date   CHOL 172 05/07/2022   HDL 33.70 (L) 05/07/2022   LDLCALC 134 (H) 10/20/2021   LDLDIRECT 91.0 05/07/2022   TRIG 384.0 (H) 05/07/2022   CHOLHDL 5 05/07/2022  Using medications without problems: has cramps Muscle aches:  Diet compliance: minimal water, hear healthy Exercise:  was going 4 times a week, but has not been going since summer.. plans to restart. Other complaints:  Wt Readings from Last 3 Encounters:  05/14/22 199 lb 6 oz (90.4 kg)  04/08/22 201 lb 2 oz (91.2 kg)  10/20/21 194 lb (88 kg)    CAD, cardiomyopathy and HFrEF: Followed by cardiology  Dr. Nehemiah Massed Last OV reviewed from 09/2021:  07/23/2019 repeat ECHO  EF 40-46%.Marland Kitchen down from 2020  On Coreg, Entresto, spironolactone. 10/2021.Marland Kitchen EF50-55%! He is euvolemic today.  Relevant past medical, surgical, family and social history reviewed and updated as indicated. Interim medical history since our last visit reviewed. Allergies and medications reviewed and updated. Outpatient  Medications Prior to Visit  Medication Sig Dispense Refill   Ascorbic Acid (VITAMIN C PO) Take 1 tablet by mouth daily.     ASPIRIN LOW DOSE 81 MG EC tablet TAKE 1 TABLET BY MOUTH EVERY DAY 90 tablet 3   atorvastatin (LIPITOR) 80 MG tablet Take 1 tablet (80 mg total) by mouth daily. 90 tablet 2   b complex vitamins tablet Take 1 tablet by mouth daily. Unknown strength     BIOTIN PO Take 1 capsule by mouth daily. Unknown strength     carvedilol (COREG) 12.5 MG tablet Take 1 tablet (12.5 mg total) by mouth 2 (two) times daily with a meal. 180 tablet 1   cetirizine (ZYRTEC) 10 MG tablet Take 10 mg by mouth daily.     Cholecalciferol 25 MCG (1000 UT) tablet Take 2,000 Units by mouth daily.     Chondroitin Sulfate-Vit C-Mn (CHONDROITIN SULFATE COMPLEX PO) Take 2 tablets by mouth daily. Unknown strength     ENTRESTO 49-51 MG TAKE 1 TABLET BY MOUTH TWICE A DAY 180 tablet 2   MAGNESIUM PO Take 1 tablet by mouth daily. Unknown strength     Menaquinone-7 (K2 PO) Take by mouth.     Multiple Vitamins-Minerals (CENTRUM SILVER ULTRA MENS PO) Take 1 tablet by mouth daily. Unknown strength     nitroGLYCERIN (NITROSTAT) 0.4 MG SL tablet  Place 0.4 mg under the tongue every 5 (five) minutes as needed for chest pain.     spironolactone (ALDACTONE) 25 MG tablet TAKE 1 TABLET (25 MG TOTAL) BY MOUTH DAILY. 90 tablet 1   terbinafine (LAMISIL) 250 MG tablet Take 1 tablet (250 mg total) by mouth daily. 90 tablet 0   amoxicillin-clavulanate (AUGMENTIN) 875-125 MG tablet Take 1 tablet by mouth 2 (two) times daily. 20 tablet 0   No facility-administered medications prior to visit.     Per HPI unless specifically indicated in ROS section below Review of Systems  Constitutional:  Negative for fatigue and fever.  HENT:  Negative for ear pain.   Eyes:  Negative for pain.  Respiratory:  Negative for cough and shortness of breath.   Cardiovascular:  Negative for chest pain, palpitations and leg swelling.   Gastrointestinal:  Negative for abdominal pain.  Genitourinary:  Negative for dysuria.  Musculoskeletal:  Negative for arthralgias.  Neurological:  Negative for syncope, light-headedness and headaches.  Psychiatric/Behavioral:  Negative for dysphoric mood.    Objective:  BP 90/62   Pulse 80   Temp 98.9 F (37.2 C) (Oral)   Ht 5' 8.5" (1.74 m)   Wt 199 lb 6 oz (90.4 kg)   SpO2 96%   BMI 29.87 kg/m   Wt Readings from Last 3 Encounters:  05/14/22 199 lb 6 oz (90.4 kg)  04/08/22 201 lb 2 oz (91.2 kg)  10/20/21 194 lb (88 kg)      Physical Exam Constitutional:      Appearance: He is well-developed.  HENT:     Head: Normocephalic.     Right Ear: Hearing normal.     Left Ear: Hearing normal.     Nose: Nose normal.  Neck:     Thyroid: No thyroid mass or thyromegaly.     Vascular: No carotid bruit.     Trachea: Trachea normal.  Cardiovascular:     Rate and Rhythm: Normal rate and regular rhythm.     Pulses: Normal pulses.     Heart sounds: Heart sounds not distant. No murmur heard.    No friction rub. No gallop.     Comments: No peripheral edema Pulmonary:     Effort: Pulmonary effort is normal. No respiratory distress.     Breath sounds: Normal breath sounds.  Skin:    General: Skin is warm and dry.     Findings: No rash.     Comments: SK on the right I  Psychiatric:        Speech: Speech normal.        Behavior: Behavior normal.        Thought Content: Thought content normal.          Results for orders placed or performed in visit on 05/07/22  Comprehensive metabolic panel  Result Value Ref Range   Sodium 139 135 - 145 mEq/L   Potassium 4.0 3.5 - 5.1 mEq/L   Chloride 105 96 - 112 mEq/L   CO2 26 19 - 32 mEq/L   Glucose, Bld 91 70 - 99 mg/dL   BUN 21 6 - 23 mg/dL   Creatinine, Ser 0.76 0.40 - 1.50 mg/dL   Total Bilirubin 0.4 0.2 - 1.2 mg/dL   Alkaline Phosphatase 65 39 - 117 U/L   AST 25 0 - 37 U/L   ALT 29 0 - 53 U/L   Total Protein 6.8 6.0 - 8.3 g/dL    Albumin 4.1 3.5 - 5.2 g/dL  GFR 93.99 >60.00 mL/min   Calcium 9.2 8.4 - 10.5 mg/dL  Lipid panel  Result Value Ref Range   Cholesterol 172 0 - 200 mg/dL   Triglycerides 384.0 (H) 0.0 - 149.0 mg/dL   HDL 33.70 (L) >39.00 mg/dL   VLDL 76.8 (H) 0.0 - 40.0 mg/dL   Total CHOL/HDL Ratio 5    NonHDL 138.26   LDL cholesterol, direct  Result Value Ref Range   Direct LDL 91.0 mg/dL     COVID 19 screen:  No recent travel or known exposure to COVID19 The patient denies respiratory symptoms of COVID 19 at this time. The importance of social distancing was discussed today.   Assessment and Plan The patient's preventative maintenance and recommended screening tests for an annual wellness exam were reviewed in full today. Brought up to date unless services declined.  Counselled on the importance of diet, exercise, and its role in overall health and mortality. The patient's FH and SH was reviewed, including their home life, tobacco status, and drug and alcohol status.   Vaccines: uptodate except due for prevnar 20.  Discussed COVID19 vaccine side effects and benefits. Strongly encouraged the patient to get the vaccine. Questions answered. .Prostate Cancer Screen: due  For re-eval Lab Results  Component Value Date   PSA 0.49 08/05/2020   PSA 0.52 08/28/2019   PSA 1.38 07/19/2017  Colon Cancer Screen: 12/05/2020  Dr. Fuller Plan, sessile polyp, repeat in 7 years      Smoking Status: nonsmoker ETOH/ drug use: none/none  Hep C:   due  HIV screen:   refused.   Problem List Items Addressed This Visit     Cardiomyopathy ([place])    Chronic, followed by cardiology      Dupuytren's contracture of right hand    Chronic, worsening no interference with function at this point but I encouraged him to speak with the hand surgeon for possible treatment options.      Relevant Orders   Ambulatory referral to Hand Surgery   Heart failure with reduced ejection fraction ([place])    Followed by Dr. Army Melia  cardiology.  Repeat echocardiogram in 2023 showed EF of 50 to 55% on Coreg, Entresto and spironolactone. He is euvolemic in office today      Nonsustained ventricular tachycardia ([place])    No further episodes.      Pure hypercholesterolemia     On atorvastatin  80 mg daily LDL not at goal < 70. He will work on getting back on track with regular exercise and weight loss.  We discussed his diet in detail and ways to lower cholesterol.      Seborrheic keratosis    Chronic, under right eye.  No concerning qualities.  Encouraged patient to keep up with annual dermatology exams given family history of melanoma.      Other Visit Diagnoses     Routine general medical examination at a health care facility    -  Primary   Prostate cancer screening       Relevant Orders   PSA         Eliezer Lofts, MD

## 2022-05-14 NOTE — Assessment & Plan Note (Signed)
No further episodes

## 2022-05-14 NOTE — Patient Instructions (Addendum)
Please stop at the lab to have labs drawn.  Can try coenzyme q10 and increase water intake.  We will  move forward with hand surgery referral.  Get back to regular exercise.

## 2022-05-14 NOTE — Addendum Note (Signed)
Addended by: Carter Kitten on: 05/14/2022 09:18 AM   Modules accepted: Orders

## 2022-06-02 DIAGNOSIS — M720 Palmar fascial fibromatosis [Dupuytren]: Secondary | ICD-10-CM | POA: Diagnosis not present

## 2022-07-07 ENCOUNTER — Encounter: Payer: Self-pay | Admitting: Cardiology

## 2022-07-07 ENCOUNTER — Ambulatory Visit: Payer: BC Managed Care – PPO | Attending: Cardiology | Admitting: Cardiology

## 2022-07-07 VITALS — BP 112/72 | HR 73 | Ht 69.5 in | Wt 210.0 lb

## 2022-07-07 DIAGNOSIS — E785 Hyperlipidemia, unspecified: Secondary | ICD-10-CM | POA: Diagnosis not present

## 2022-07-07 DIAGNOSIS — I4729 Other ventricular tachycardia: Secondary | ICD-10-CM | POA: Diagnosis not present

## 2022-07-07 DIAGNOSIS — I420 Dilated cardiomyopathy: Secondary | ICD-10-CM | POA: Diagnosis not present

## 2022-07-07 DIAGNOSIS — I2510 Atherosclerotic heart disease of native coronary artery without angina pectoris: Secondary | ICD-10-CM | POA: Diagnosis not present

## 2022-07-07 HISTORY — DX: Hyperlipidemia, unspecified: E78.5

## 2022-07-07 MED ORDER — CARVEDILOL 12.5 MG PO TABS: 12.5000 mg | ORAL_TABLET | Freq: Two times a day (BID) | ORAL | 3 refills | Status: DC

## 2022-07-07 MED ORDER — NITROGLYCERIN 0.4 MG SL SUBL: 0.4000 mg | SUBLINGUAL_TABLET | SUBLINGUAL | 11 refills | Status: AC | PRN

## 2022-07-07 NOTE — Progress Notes (Signed)
Cardiology Office Note:    Date:  07/07/2022   ID:  Lucas Willis, DOB September 03, 1955, MRN [id number]  PCP:  Lucas Sanders, MD  Cardiologist:  Lucas Campus, MD    Referring MD: Lucas Sanders, MD   Chief Complaint  Patient presents with   Follow-up    History of Present Illness:    Lucas Willis is a 67 y.o. male with past medical history significant for nonischemic cardiomyopathy, however echocardiogram previously reported ejection fraction 45%, latest echocardiogram showed normal left ventricle ejection fraction, nonobstructive coronary artery disease based on cardiac catheterization from 2017, dyslipidemia, hypertriglyceridemia, leg cramps.  He comes today to months for follow-up.  Last echocardiogram showed normalization of left ventricle ejection Fraction is very happy because of that.  He is very busy at work he does not have time to exercise before he was training for mini triathlon however never completed this because of poor weather that day.  He denies have any cardiac complaints there is no chest pain tightness squeezing pressure mid chest there is no dizziness there is no passing out.  Past Medical History:  Diagnosis Date   CAD, nonobstructive on 2017 cath 07/19/2017   Cardiomyopathy ([place]) [id number]   Chronic systolic heart failure ([place]) 09/04/2015   COVID    Fatigue 07/19/2017   Heart failure with reduced ejection fraction ([place]) 09/04/2015   Followed by Dr. Agust[facility]. EF 43% on ECHO 07/19/2016  Repeat 2021:  EF40-46%   Leg cramps 05/14/2014   Osteoarthritis of left knee 04/05/2018   Pure hypercholesterolemia 06/04/2014    Past Surgical History:  Procedure Laterality Date   CARDIAC CATHETERIZATION N/A 09/11/2015   Procedure: Right/Left Heart Cath and Coronary Angiography;  Surgeon: Lucas M Martinique, MD;  Location: [facility];  Service: Cardiovascular;  Laterality: N/A;   COLONOSCOPY  2011   MS-F/V-movi(good)-TA-recall 20yr   CYST REMOVAL LEG  Left 1970   back of L knee   HERNIA REPAIR Left 1979   inguinal   KNEE ARTHROSCOPY Left 2021   PLANTAR'S WART EXCISION  1972   POLYPECTOMY  2011   TA   SHOULDER SURGERY Right 2008   Dislocation Repair   TONSILLECTOMY     WISDOM TOOTH EXTRACTION      Current Medications: Current Meds  Medication Sig   Ascorbic Acid (VITAMIN C PO) Take 1 tablet by mouth daily.   ASPIRIN LOW DOSE 81 MG EC tablet TAKE 1 TABLET BY MOUTH EVERY DAY (Patient taking differently: Take 81 mg by mouth daily.)   atorvastatin (LIPITOR) 80 MG tablet Take 1 tablet (80 mg total) by mouth daily.   b complex vitamins tablet Take 1 tablet by mouth daily. Unknown strength   BIOTIN PO Take 1 capsule by mouth daily. Unknown strength   carvedilol (COREG) 12.5 MG tablet Take 1 tablet (12.5 mg total) by mouth 2 (two) times daily with a meal.   cetirizine (ZYRTEC) 10 MG tablet Take 10 mg by mouth daily.   Cholecalciferol 25 MCG (1000 UT) tablet Take 2,000 Units by mouth daily.   Chondroitin Sulfate-Vit C-Mn (CHONDROITIN SULFATE COMPLEX PO) Take 2 tablets by mouth daily. Unknown strength   Coenzyme Q10 (CO Q 10) 100 MG CAPS Take 10 mg by mouth daily.   ENTRESTO 49-51 MG TAKE 1 TABLET BY MOUTH TWICE A DAY (Patient taking differently: Take 1 tablet by mouth 2 (two) times daily.)   MAGNESIUM PO Take 1 tablet by mouth daily. Unknown strength   Menaquinone-7 (K2  PO) Take 1 tablet by mouth daily.   Multiple Vitamins-Minerals (CENTRUM SILVER ULTRA MENS PO) Take 1 tablet by mouth daily. Unknown strength   nitroGLYCERIN (NITROSTAT) 0.4 MG SL tablet Place 0.4 mg under the tongue every 5 (five) minutes as needed for chest pain.   spironolactone (ALDACTONE) 25 MG tablet TAKE 1 TABLET (25 MG TOTAL) BY MOUTH DAILY.   terbinafine (LAMISIL) 250 MG tablet Take 1 tablet (250 mg total) by mouth daily.     Allergies:   Demerol [meperidine] and Sulfonamide derivatives   Social History   Socioeconomic History   Marital status: Married     Spouse name: Lucas Willis   Number of children: 2   Years of education: Not on file   Highest education level: Not on file  Occupational History   Occupation: Art therapist  Tobacco Use   Smoking status: Never   Smokeless tobacco: Never  Vaping Use   Vaping Use: Never used  Substance and Sexual Activity   Alcohol use: Yes    Comment: rarely   Drug use: No   Sexual activity: Yes  Other Topics Concern   Not on file  Social History Narrative   2 kids, 42 and 52 year old.    Married   Social Determinants of Radio broadcast assistant Strain: Not on file  Food Insecurity: Not on file  Transportation Needs: Not on file  Physical Activity: Not on file  Stress: Not on file  Social Connections: Not on file     Family History: The patient's family history includes Heart failure in his mother. There is no history of Colon polyps, Colon cancer, Esophageal cancer, Stomach cancer, or Rectal cancer. ROS:   Please see the history of present illness.    All 14 point review of systems negative except as described per history of present illness  EKGs/Labs/Other Studies Reviewed:      Recent Labs: 05/07/2022: ALT 29; BUN 21; Creatinine, Ser 0.76; Potassium 4.0; Sodium 139  Recent Lipid Panel    Component Value Date/Time   CHOL 172 05/07/2022 0809   CHOL 207 (H) 10/20/2021 0938   TRIG 384.0 (H) 05/07/2022 0809   TRIG 258 (HH) 06/08/2006 1002   HDL 33.70 (L) 05/07/2022 0809   HDL 43 10/20/2021 0938   CHOLHDL 5 05/07/2022 0809   VLDL 76.8 (H) 05/07/2022 0809   LDLCALC 134 (H) 10/20/2021 0938   LDLCALC 89 04/11/2018 0734   LDLDIRECT 91.0 05/07/2022 0809    Physical Exam:    VS:  BP 112/72 (BP Location: Left Arm, Patient Position: Sitting)   Pulse 73   Ht 5' 9.5" (1.765 m)   Wt 210 lb (95.3 kg)   SpO2 97%   BMI 30.57 kg/m     Wt Readings from Last 3 Encounters:  07/07/22 210 lb (95.3 kg)  05/14/22 199 lb 6 oz (90.4 kg)  04/08/22 201 lb 2 oz (91.2 kg)     GEN:   Well nourished, well developed in no acute distress HEENT: Normal NECK: No JVD; No carotid bruits LYMPHATICS: No lymphadenopathy CARDIAC: RRR, no murmurs, no rubs, no gallops RESPIRATORY:  Clear to auscultation without rales, wheezing or rhonchi  ABDOMEN: Soft, non-tender, non-distended MUSCULOSKELETAL:  No edema; No deformity  SKIN: Warm and dry LOWER EXTREMITIES: no swelling NEUROLOGIC:  Alert and oriented x 3 PSYCHIATRIC:  Normal affect   ASSESSMENT:    1. Dilated cardiomyopathy ([place])   2. 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 Nonsustained ventricular tachycardia (  [place])   4. Dyslipidemia    PLAN:    In order of problems listed above:  Dilated cardiomyopathy, normalization of left ventricle ejection fraction.  Will continue monitoring.  Will repeat his echocardiogram in summertime. Nonobstructive coronary artery disease, does not have any signs and symptoms indicating worsening of the problem.  He is on aspirin antiplatelet therapy which I will continue. Dyslipidemia I did review his K PN however I have incomplete data for last measurements which was done in December 8 total cholesterol 172 HDL 33, triglycerides 384.  We had a long discussion about this.  He promised to BL be more active and I told him the best way to lower his triglycerides will be to be active lost some weight he will try to do this, I will put order in the computer and have his cholesterol checked in about 3 months. Nonsustained ventricular tachycardia denies have any symptoms.  Normalization of left ventricle ejection fraction noted on last echo I was chaperoned by Valentino Hue during entire visit  Medication Adjustments/Labs and Tests Ordered: Current medicines are reviewed at length with the patient today.  Concerns regarding medicines are outlined above.  No orders of the defined types were placed in this encounter.  Medication changes: No orders of the defined types were placed in this  encounter.   Signed, Park Liter, MD, [facility] 07/07/2022 9:22 AM    [place]

## 2022-07-07 NOTE — Patient Instructions (Addendum)
Medication Instructions:  Your physician recommends that you continue on your current medications as directed. Please refer to the Current Medication list given to you today.  *If you need a refill on your cardiac medications before your next appointment, please call your pharmacy*   Lab Work: [facility] recommends that you return for lab work in: 3 months You need to have labs done when you are fasting.  You can come Monday through Friday 8:00 am to 12:00 pm and 1:00 to 4:00. You do not need to make an appointment as the order has already been placed.   Testing/Procedures: None Ordered   Follow-Up: At [facility], you and your health needs are our priority.  As part of our continuing mission to provide you with exceptional heart care, we have created designated Provider Care Teams.  These Care Teams include your primary Cardiologist (physician) and Advanced Practice Providers (APPs -  Physician Assistants and Nurse Practitioners) who all work together to provide you with the care you need, when you need it.  We recommend signing up for the patient portal called "MyChart".  Sign up information is provided on this After Visit Summary.  MyChart is used to connect with patients for Virtual Visits (Telemedicine).  Patients are able to view lab/test results, encounter notes, upcoming appointments, etc.  Non-urgent messages can be sent to your provider as well.   To learn more about what you can do with MyChart, go to [email or web address].    Your next appointment:   6 month(s)  The format for your next appointment:   In Person  Provider:   Jenne Campus, MD    Other Instructions NA   This visit was accompanied by Enrigue Catena.

## 2022-08-08 ENCOUNTER — Other Ambulatory Visit: Payer: Self-pay | Admitting: Cardiology

## 2022-08-09 ENCOUNTER — Other Ambulatory Visit: Payer: Self-pay | Admitting: Cardiology

## 2022-12-13 ENCOUNTER — Other Ambulatory Visit: Payer: Self-pay | Admitting: Cardiology

## 2023-02-09 ENCOUNTER — Ambulatory Visit: Payer: BC Managed Care – PPO | Attending: Cardiology | Admitting: Cardiology

## 2023-02-09 ENCOUNTER — Encounter: Payer: Self-pay | Admitting: Cardiology

## 2023-02-09 VITALS — BP 96/64 | HR 73 | Ht 69.5 in | Wt 197.0 lb

## 2023-02-09 DIAGNOSIS — I420 Dilated cardiomyopathy: Secondary | ICD-10-CM

## 2023-02-09 DIAGNOSIS — I2510 Atherosclerotic heart disease of native coronary artery without angina pectoris: Secondary | ICD-10-CM | POA: Diagnosis not present

## 2023-02-09 DIAGNOSIS — E785 Hyperlipidemia, unspecified: Secondary | ICD-10-CM

## 2023-02-09 DIAGNOSIS — I4729 Other ventricular tachycardia: Secondary | ICD-10-CM | POA: Diagnosis not present

## 2023-02-09 NOTE — Progress Notes (Signed)
Cardiology Office Note:    Date:  02/09/2023   ID:  Lucas Willis, DOB [birth year], MRN [id number]  PCP:  Lucas Seltzer, MD  Cardiologist:  Lucas Balsam, MD    Referring MD: Lucas Seltzer, MD   Chief Complaint  Patient presents with   Lung Issue    Ongoing for 1 month    History of Present Illness:    Lucas Willis is a 68 y.o. male with past medical history significant nonischemic cardiomyopathy ejection fraction previously reported as 45%, last echocardiogram however showed improvement of left ventricle ejection fraction with normalization.  He did have a cardiac catheterization done in 2017 which show nonobstructive disease.  Additional problem include dyslipidemia leg cramps.  He comes today to months for regular follow-up.  Overall doing well but he described that he gets a long history of what he mean by that he get chronic cough cough interestingly it is worse in the afternoon but also he noticed some changes in his voice.  Denies have any chest pain tightness squeezing pressure burning chest.  He used to exercise on the regular basis but stopped doing it.  He was enjoying time with his grandson during the summer his grandson is 86 years old he is in high school.  He did not notice any decrease ability to exercise but he admits that he does not exercise and tolerate.  Past Medical History:  Diagnosis Date   CAD, nonobstructive on 2017 cath 07/19/2017   Cardiomyopathy ([facility]) 09/04/2015   Chronic systolic heart failure ([facility]) 09/04/2015   COVID    Fatigue 07/19/2017   Heart failure with reduced ejection fraction ([facility]) 09/04/2015   Followed by Dr. [facility]. EF 43% on ECHO 07/19/2016  Repeat 2021:  EF40-46%   Leg cramps 05/14/2014   Osteoarthritis of left knee 04/05/2018   Pure hypercholesterolemia 06/04/2014    Past Surgical History:  Procedure Laterality Date   CARDIAC CATHETERIZATION N/A 09/11/2015   Procedure: Right/Left Heart Cath and Coronary  Angiography;  Surgeon: Lucas M Swaziland, MD;  Location: [facility];  Service: Cardiovascular;  Laterality: N/A;   COLONOSCOPY  2011   MS-F/V-movi(good)-TA-recall 87yrs   CYST REMOVAL LEG Left 1970   back of L knee   HERNIA REPAIR Left 1979   inguinal   KNEE ARTHROSCOPY Left 2021   PLANTAR'S WART EXCISION  1972   POLYPECTOMY  2011   TA   SHOULDER SURGERY Right 2008   Dislocation Repair   TONSILLECTOMY     WISDOM TOOTH EXTRACTION      Current Medications: Current Meds  Medication Sig   Ascorbic Acid (VITAMIN C PO) Take 1 tablet by mouth daily.   aspirin EC (ASPIRIN LOW DOSE) 81 MG tablet TAKE 1 TABLET BY MOUTH EVERY DAY (Patient taking differently: Take 81 mg by mouth daily.)   atorvastatin (LIPITOR) 80 MG tablet Take 1 tablet (80 mg total) by mouth daily.   b complex vitamins tablet Take 1 tablet by mouth daily. Unknown strength   BIOTIN PO Take 1 capsule by mouth daily. Unknown strength   carvedilol (COREG) 12.5 MG tablet Take 1 tablet (12.5 mg total) by mouth 2 (two) times daily with a meal.   cetirizine (ZYRTEC) 10 MG tablet Take 10 mg by mouth daily.   Cholecalciferol 25 MCG (1000 UT) tablet Take 2,000 Units by mouth daily.   Chondroitin Sulfate-Vit C-Mn (CHONDROITIN SULFATE COMPLEX PO) Take 2 tablets by mouth daily. Unknown strength   Coenzyme Q10 (CO  Q 10) 100 MG CAPS Take 10 mg by mouth daily.   MAGNESIUM PO Take 1 tablet by mouth daily. Unknown strength   Multiple Vitamins-Minerals (CENTRUM SILVER ULTRA MENS PO) Take 1 tablet by mouth daily. Unknown strength   nitroGLYCERIN (NITROSTAT) 0.4 MG SL tablet Place 1 tablet (0.4 mg total) under the tongue every 5 (five) minutes as needed for chest pain.   sacubitril-valsartan (ENTRESTO) 49-51 MG Take 1 tablet by mouth 2 (two) times daily.   spironolactone (ALDACTONE) 25 MG tablet Take 1 tablet (25 mg total) by mouth daily.   terbinafine (LAMISIL) 250 MG tablet Take 1 tablet (250 mg total) by mouth daily.     Allergies:    Demerol [meperidine] and Sulfonamide derivatives   Social History   Socioeconomic History   Marital status: Married    Spouse name: Lucas Willis   Number of children: 2   Years of education: Not on file   Highest education level: Not on file  Occupational History   Occupation: Print production planner  Tobacco Use   Smoking status: Never   Smokeless tobacco: Never  Vaping Use   Vaping status: Never Used  Substance and Sexual Activity   Alcohol use: Yes    Comment: rarely   Drug use: No   Sexual activity: Yes  Other Topics Concern   Not on file  Social History Narrative   2 kids, 16 and 31 year old.    Married   Social Determinants of Corporate investment banker Strain: Not on file  Food Insecurity: Not on file  Transportation Needs: Not on file  Physical Activity: Not on file  Stress: Not on file  Social Connections: Not on file     Family History: The patient's family history includes Heart failure in his mother. There is no history of Colon polyps, Colon cancer, Esophageal cancer, Stomach cancer, or Rectal cancer. ROS:   Please see the history of present illness.    All 14 point review of systems negative except as described per history of present illness  EKGs/Labs/Other Studies Reviewed:         Recent Labs: 05/07/2022: ALT 29; BUN 21; Creatinine, Ser 0.76; Potassium 4.0; Sodium 139  Recent Lipid Panel    Component Value Date/Time   CHOL 172 05/07/2022 0809   CHOL 207 (H) 10/20/2021 0938   TRIG 384.0 (H) 05/07/2022 0809   TRIG 258 (HH) 06/08/2006 1002   HDL 33.70 (L) 05/07/2022 0809   HDL 43 10/20/2021 0938   CHOLHDL 5 05/07/2022 0809   VLDL 76.8 (H) 05/07/2022 0809   LDLCALC 134 (H) 10/20/2021 0938   LDLCALC 89 04/11/2018 0734   LDLDIRECT 91.0 05/07/2022 0809    Physical Exam:    VS:  BP 96/64 (BP Location: Right Arm, Patient Position: Sitting)   Pulse 73   Ht 5' 9.5" (1.765 m)   Wt 197 lb (89.4 kg)   SpO2 97%   BMI 28.67 kg/m     Wt Readings  from Last 3 Encounters:  02/09/23 197 lb (89.4 kg)  07/07/22 210 lb (95.3 kg)  05/14/22 199 lb 6 oz (90.4 kg)     GEN:  Well nourished, well developed in no acute distress HEENT: Normal NECK: No JVD; No carotid bruits LYMPHATICS: No lymphadenopathy CARDIAC: RRR, no murmurs, no rubs, no gallops RESPIRATORY:  Clear to auscultation without rales, wheezing or rhonchi  ABDOMEN: Soft, non-tender, non-distended MUSCULOSKELETAL:  No edema; No deformity  SKIN: Warm and dry LOWER EXTREMITIES: no swelling  NEUROLOGIC:  Alert and oriented x 3 PSYCHIATRIC:  Normal affect   ASSESSMENT:    1. Dilated cardiomyopathy ([facility])   2. 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 Nonsustained ventricular tachycardia ([facility])   4. Dyslipidemia    PLAN:    In order of problems listed above:  History of cardiomyopathy I will repeat his echocardiogram recheck left ventricle ejection fraction. Coronary disease nonobstructive based on cardiac cath from 2017.  He does not have any symptoms that would suggest reactivation of the problem.  Will continue monitoring and use antiplatelets therapy. Dyslipidemia I did review his K PN which show me LDL and 134 and HDL of 33.  Will check fasting lipid profile this numbers from last year. Cough a think he had some lung issue.  I talked with him and I recommend to see his primary care physician in the meantime he can try some Nasonex as well as some proton pump inhibitors to see if it helps I told him if cough continue him process probably required CT of the chest   Medication Adjustments/Labs and Tests Ordered: Current medicines are reviewed at length with the patient today.  Concerns regarding medicines are outlined above.  Orders Placed This Encounter  Procedures   EKG 12-Lead   Medication changes: No orders of the defined types were placed in this encounter.   Signed, Georgeanna Lea, MD, [facility] 02/09/2023 9:47 AM    [facility]

## 2023-02-09 NOTE — Patient Instructions (Addendum)
Medication Instructions:  Your physician recommends that you continue on your current medications as directed. Please refer to the Current Medication list given to you today.  *If you need a refill on your cardiac medications before your next appointment, please call your pharmacy*   Lab Work: 3rd Floor   [address]  Your physician recommends that you return for lab work in:  when fasting  You need to have labs done when you are fasting.  You can come Monday through Friday 8:00 am to 11:30AM and 1:00 to 4:00. You do not need to make an appointment as the order has already been placed.   Testing/Procedures: Your physician has requested that you have an echocardiogram. Echocardiography is a painless test that uses sound waves to create images of your heart. It provides your doctor with information about the size and shape of your heart and how well your heart's chambers and valves are working. This procedure takes approximately one hour. There are no restrictions for this procedure. Please do NOT wear cologne, perfume, aftershave, or lotions (deodorant is allowed). Please arrive 15 minutes prior to your appointment time.    Follow-Up: At [facility], you and your health needs are our priority.  As part of our continuing mission to provide you with exceptional heart care, we have created designated Provider Care Teams.  These Care Teams include your primary Cardiologist (physician) and Advanced Practice Providers (APPs -  Physician Assistants and Nurse Practitioners) who all work together to provide you with the care you need, when you need it.  We recommend signing up for the patient portal called "MyChart".  Sign up information is provided on this After Visit Summary.  MyChart is used to connect with patients for Virtual Visits (Telemedicine).  Patients are able to view lab/test results, encounter notes, upcoming appointments, etc.  Non-urgent messages can be sent to your provider as well.    To learn more about what you can do with MyChart, go to [email or web address].    Your next appointment:   6 month(s)  The format for your next appointment:   In Person  Provider:   Gypsy Balsam, MD    Other Instructions NA

## 2023-02-10 LAB — LIPID PANEL
Chol/HDL Ratio: 2.9 ratio (ref 0.0–5.0)
Cholesterol, Total: 131 mg/dL (ref 100–199)
HDL: 45 mg/dL (ref >39)
LDL Chol Calc (NIH): 68 mg/dL (ref 0–99)
Triglycerides: 95 mg/dL (ref 0–149)
VLDL Cholesterol Cal: 18 mg/dL (ref 5–40)

## 2023-02-10 LAB — AST: AST: 32 IU/L (ref 0–40)

## 2023-02-10 LAB — CBC
Hematocrit: 42.8 % (ref 37.5–51.0)
Hemoglobin: 14.4 g/dL (ref 13.0–17.7)
MCH: 33.5 pg — ABNORMAL HIGH (ref 26.6–33.0)
MCHC: 33.6 g/dL (ref 31.5–35.7)
MCV: 100 fL — ABNORMAL HIGH (ref 79–97)
Platelets: 216 10*3/uL (ref 150–450)
RBC: 4.3 x10E6/uL (ref 4.14–5.80)
RDW: 12.5 % (ref 11.6–15.4)
WBC: 5.5 10*3/uL (ref 3.4–10.8)

## 2023-02-10 LAB — ALT: ALT: 49 IU/L — ABNORMAL HIGH (ref 0–44)

## 2023-02-12 DIAGNOSIS — R059 Cough, unspecified: Secondary | ICD-10-CM | POA: Diagnosis not present

## 2023-02-15 ENCOUNTER — Telehealth: Payer: Self-pay

## 2023-02-15 NOTE — Telephone Encounter (Signed)
-----   Message from Gypsy Balsam sent at 02/11/2023 12:20 PM EDT ----- Course acceptable, continue present management

## 2023-02-15 NOTE — Telephone Encounter (Signed)
Patient notified through my chart.

## 2023-03-10 ENCOUNTER — Ambulatory Visit (HOSPITAL_BASED_OUTPATIENT_CLINIC_OR_DEPARTMENT_OTHER)
Admission: RE | Admit: 2023-03-10 | Discharge: 2023-03-10 | Disposition: A | Discharge status: Home / Self Care | Payer: BC Managed Care – PPO | Source: Ambulatory Visit | Attending: Cardiology | Admitting: Cardiology

## 2023-03-10 DIAGNOSIS — I420 Dilated cardiomyopathy: Secondary | ICD-10-CM | POA: Diagnosis not present

## 2023-03-15 ENCOUNTER — Other Ambulatory Visit: Payer: Self-pay | Admitting: Cardiology

## 2023-03-15 LAB — ECHOCARDIOGRAM COMPLETE
AR max vel: 2.46 cm2
AV Area VTI: 2.62 cm2
AV Area mean vel: 2.35 cm2
AV Mean grad: 2 mm[Hg]
AV Peak grad: 4.1 mm[Hg]
Ao pk vel: 1.01 m/s
Area-P 1/2: 4.41 cm2
Calc EF: 58.3 %
MV M vel: 3.66 m/s
MV Peak grad: 53.6 mm[Hg]
S' Lateral: 3.4 cm
Single Plane A2C EF: 61.6 %
Single Plane A4C EF: 57.7 %

## 2023-03-23 ENCOUNTER — Telehealth: Payer: Self-pay

## 2023-03-23 NOTE — Telephone Encounter (Signed)
-----   Message from Gypsy Balsam sent at 03/21/2023 12:15 PM EDT ----- Echocardiogram showed preserved left ventricular ejection fraction and no significant valvular pathology

## 2023-03-23 NOTE — Telephone Encounter (Signed)
Patient notified of results and verbalized understanding.  

## 2023-03-26 IMAGING — MG DIGITAL DIAGNOSTIC BILAT W/ TOMO W/ CAD
6 of 10 series · 6 of 30 positions shown · non-contrast
Comparison: None

ACR Breast Density Category a: The breast tissue is almost entirely
fatty.

CLINICAL DATA: 64-year-old male with RETROAREOLAR RIGHT breast
thickening and tenderness.

EXAM:
DIGITAL DIAGNOSTIC BILATERAL MAMMOGRAM WITH CAD AND TOMO

[R TAN synth-2D]
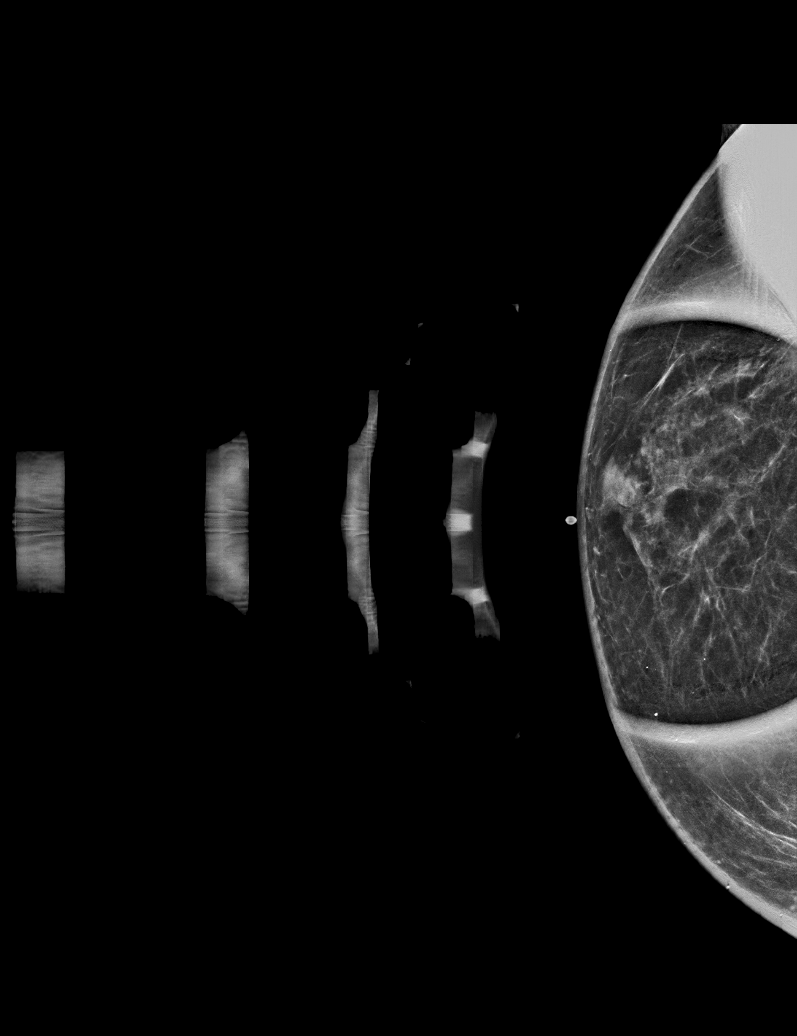

[R CC synth-2D]
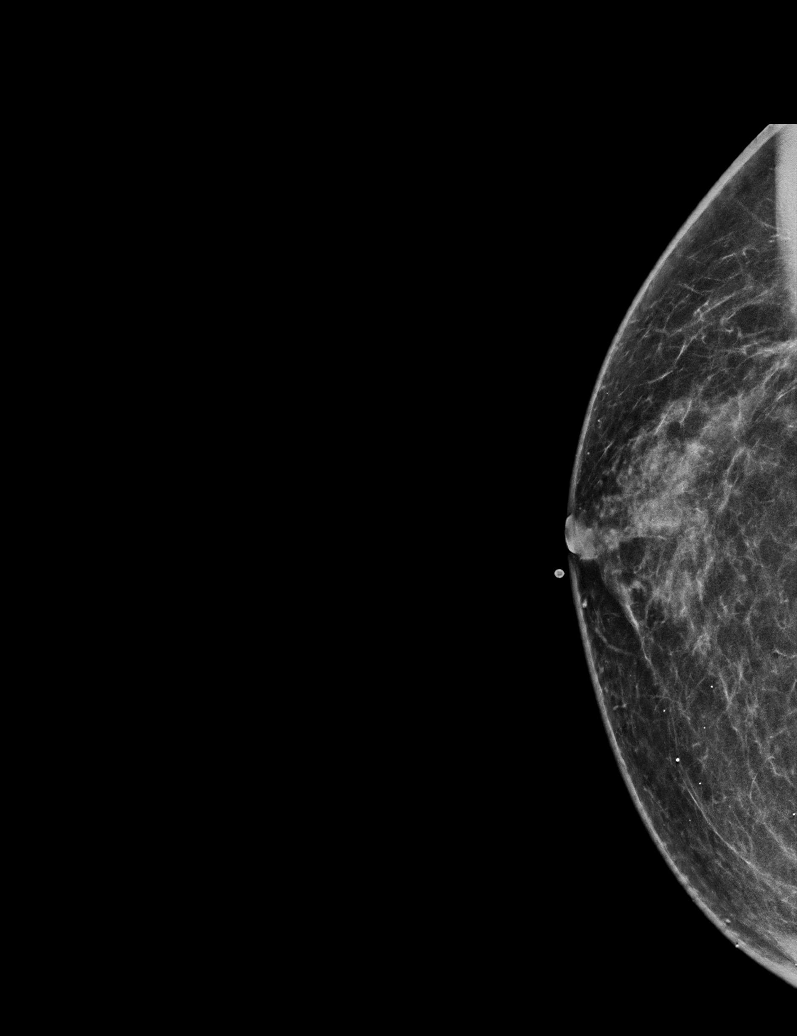

[R MLO synth-2D]
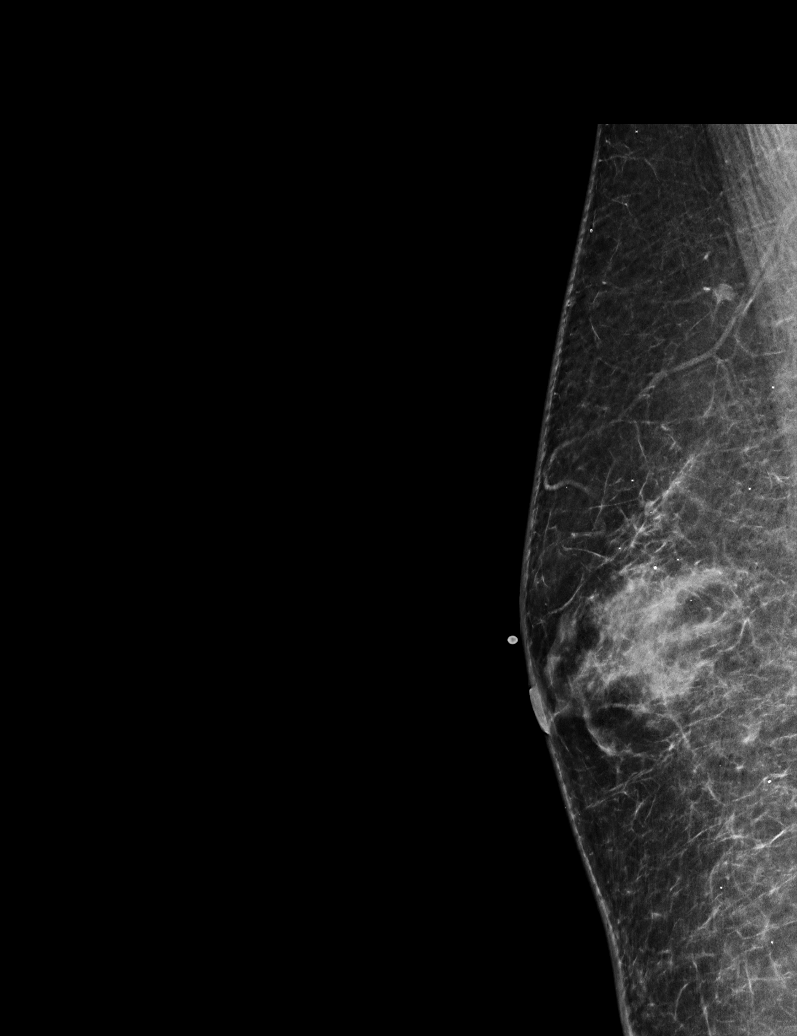

[L CC synth-2D]
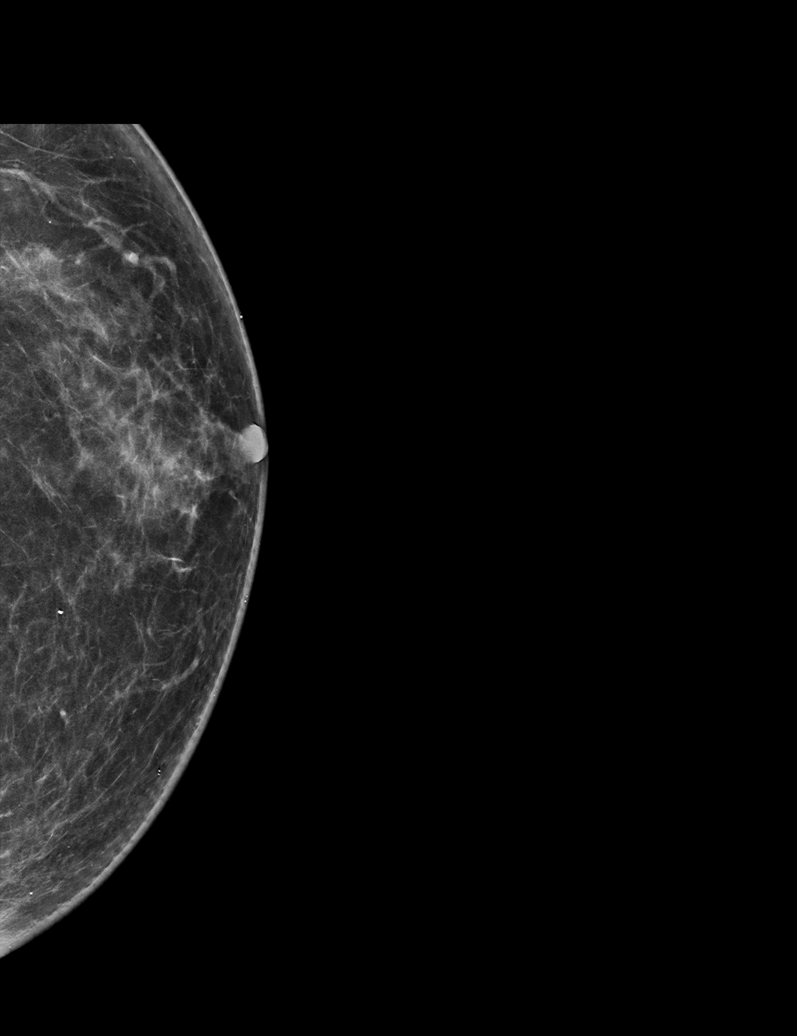

[L MLO synth-2D]
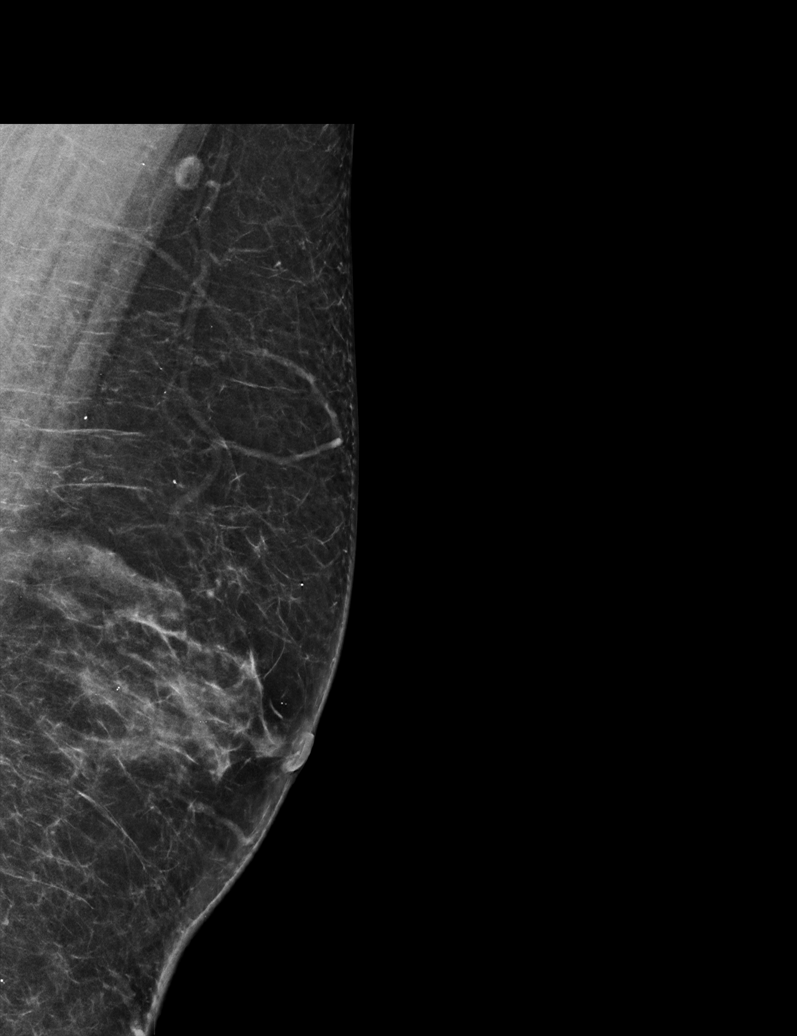

[L CC tomo · tomo slice 35/70.0]
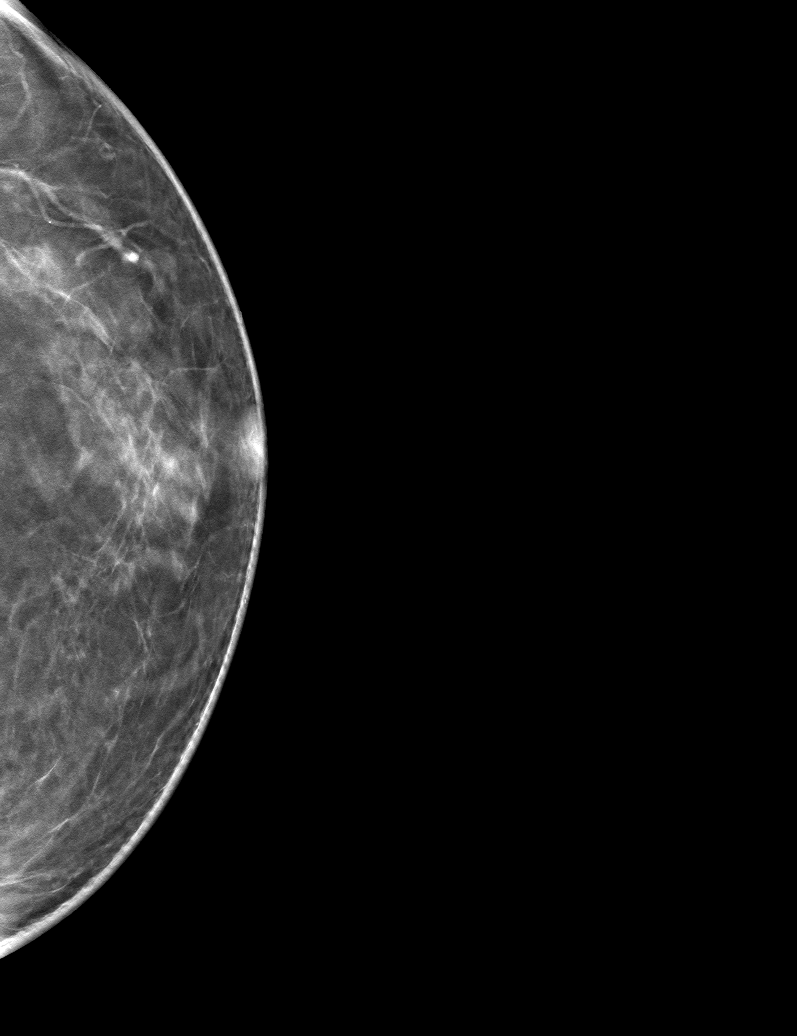

[6 of 30 positions shown; findings below may reference images not displayed]

FINDINGS: 2D/3D full field views of both breasts and spot compression view of
the RIGHT breast demonstrate moderate bilateral gynecomastia, RIGHT
greater than LEFT.

No suspicious mass, distortion or worrisome calcifications are noted
within either breast.

Mammographic images were processed with CAD.

Physical examination demonstrates RETROAREOLAR RIGHT breast
thickening without discrete palpable mass.
IMPRESSION: 1. Moderate bilateral gynecomastia, RIGHT greater than LEFT.
2. No evidence of breast malignancy.

RECOMMENDATION:
Consider clinical follow-up as indicated. Any further workup should
be based on clinical grounds.

I have discussed the findings, causes of gynecomastia and
recommendations with the patient. Results were also provided in
writing at the conclusion of the visit. If applicable, a reminder
letter will be sent to the patient regarding the next appointment.

BI-RADS CATEGORY  2: Benign.

## 2023-05-08 ENCOUNTER — Other Ambulatory Visit: Payer: Self-pay | Admitting: Cardiology

## 2023-05-09 NOTE — Telephone Encounter (Signed)
RX sent

## 2023-06-11 ENCOUNTER — Other Ambulatory Visit: Payer: Self-pay | Admitting: Cardiology

## 2023-08-13 ENCOUNTER — Ambulatory Visit
Admission: EM | Admit: 2023-08-13 | Discharge: 2023-08-13 | Disposition: A | Discharge status: Home / Self Care | Attending: Emergency Medicine | Admitting: Emergency Medicine

## 2023-08-13 DIAGNOSIS — J069 Acute upper respiratory infection, unspecified: Secondary | ICD-10-CM

## 2023-08-13 MED ORDER — BENZONATATE 100 MG PO CAPS
100.0000 mg | ORAL_CAPSULE | Freq: Three times a day (TID) | ORAL | 0 refills | Status: DC
Start: 2023-08-13 — End: 2023-11-07

## 2023-08-13 MED ORDER — GUAIFENESIN-CODEINE 100-10 MG/5ML PO SOLN: 5.0000 mL | Freq: Four times a day (QID) | ORAL | 0 refills | Status: DC | PRN

## 2023-08-13 MED ORDER — AMOXICILLIN-POT CLAVULANATE 875-125 MG PO TABS
1.0000 | ORAL_TABLET | Freq: Two times a day (BID) | ORAL | 0 refills | Status: AC
Start: 2023-08-13 — End: 2023-08-23

## 2023-08-13 NOTE — Discharge Instructions (Signed)
 Augmentin every morning and every evening for 10 days  May use Tessalon pill every 8 hours as needed for cough, may use cough syrup every 6 hours for additional comfort, be mindful this will make you feel drowsy  You can take Tylenol and/or Ibuprofen as needed for fever reduction and pain relief.   For cough: honey 1/2 to 1 teaspoon (you can dilute the honey in water or another fluid).  You can also use guaifenesin and dextromethorphan for cough. You can use a humidifier for chest congestion and cough.  If you don't have a humidifier, you can sit in the bathroom with the hot shower running.      For sore throat: try warm salt water gargles, cepacol lozenges, throat spray, warm tea or water with lemon/honey, popsicles or ice, or OTC cold relief medicine for throat discomfort.   For congestion: take a daily anti-histamine like Zyrtec, Claritin, and a oral decongestant, such as pseudoephedrine.  You can also use Flonase 1-2 sprays in each nostril daily.   It is important to stay hydrated: drink plenty of fluids (water, gatorade/powerade/pedialyte, juices, or teas) to keep your throat moisturized and help further relieve irritation/discomfort.

## 2023-08-13 NOTE — ED Triage Notes (Signed)
 Patient presents to UC head congestion runny nose x 1 week. Sore throat, lung crackling, and cough since today. States he just returned from [facility]. Treating symptoms with mucinex.  Denies fever.

## 2023-08-13 NOTE — ED Provider Notes (Signed)
 Lucas Willis    CSN: [id number] Arrival date & time: 08/13/23  1345      History   Chief Complaint Chief Complaint  Patient presents with   Cough   Sore Throat   Nasal Congestion    HPI Lucas Willis is a 68 y.o. male.   Presents for evaluation of nasal congestion present for 7 days, over the past 24 hours symptoms have progressed and he has been experiencing chest congestion, yellow to green mucus and sputum, hoarseness, intermittent headaches, wheezing and sinus tenderness to the forehead.  Recent travel from [place].  Has been using Mucinex DM.  Denies shortness of breath.   Past Medical History:  Diagnosis Date   CAD, nonobstructive on 2017 cath 07/19/2017   Cardiomyopathy (HCC) 09/04/2015   Chronic systolic heart failure (HCC) 09/04/2015   COVID    Fatigue 07/19/2017   Heart failure with reduced ejection fraction (HCC) 09/04/2015   Followed by Dr. [facility]. EF 43% on ECHO 07/19/2016  Repeat 2021:  EF40-46%   Leg cramps 05/14/2014   Osteoarthritis of left knee 04/05/2018   Pure hypercholesterolemia 06/04/2014    Patient Active Problem List   Diagnosis Date Noted   Dyslipidemia 07/07/2022   Dupuytren's contracture of right hand 05/14/2022   Seborrheic keratosis 05/14/2022   Nonsustained ventricular tachycardia (HCC) 03/18/2021   Lump of right breast 09/26/2020   S/P left knee arthroscopy 05/20/2020   Osteoarthritis of left knee 04/05/2018   CAD, nonobstructive on 2017 cath 07/19/2017   Cardiomyopathy (HCC) 09/04/2015   Heart failure with reduced ejection fraction (HCC) 09/04/2015   Pure hypercholesterolemia 06/04/2014   Leg cramps 05/14/2014    Past Surgical History:  Procedure Laterality Date   CARDIAC CATHETERIZATION N/A 09/11/2015   Procedure: Right/Left Heart Cath and Coronary Angiography;  Surgeon: Peter M Swaziland, MD;  Location: [facility] INVASIVE CV LAB;  Service: Cardiovascular;  Laterality: N/A;   COLONOSCOPY  2011    MS-F/V-movi(good)-TA-recall 20yrs   CYST REMOVAL LEG Left 1970   back of L knee   HERNIA REPAIR Left 1979   inguinal   KNEE ARTHROSCOPY Left 2021   PLANTAR'S WART EXCISION  1972   POLYPECTOMY  2011   TA   SHOULDER SURGERY Right 2008   Dislocation Repair   TONSILLECTOMY     WISDOM TOOTH EXTRACTION         Home Medications    Prior to Admission medications   Medication Sig Start Date End Date Taking? Authorizing Provider  amoxicillin-clavulanate (AUGMENTIN) 875-125 MG tablet Take 1 tablet by mouth every 12 (twelve) hours for 10 days. 08/13/23 08/23/23 Yes Paden Kuras R, NP  benzonatate (TESSALON) 100 MG capsule Take 1 capsule (100 mg total) by mouth every 8 (eight) hours. 08/13/23  Yes Rondi Ivy R, NP  guaiFENesin-codeine 100-10 MG/5ML syrup Take 5 mLs by mouth every 6 (six) hours as needed for cough. 08/13/23  Yes Jaylee Lantry R, NP  Ascorbic Acid (VITAMIN C PO) Take 1 tablet by mouth daily.    [provider]  aspirin EC (ASPIRIN LOW DOSE) 81 MG tablet Take 1 tablet (81 mg total) by mouth daily. 05/09/23   Georgeanna Lea, MD  atorvastatin (LIPITOR) 80 MG tablet Take 1 tablet (80 mg total) by mouth daily. 06/13/23   Georgeanna Lea, MD  b complex vitamins tablet Take 1 tablet by mouth daily. Unknown strength    [provider]  BIOTIN PO Take 1 capsule by mouth daily. Unknown strength  [provider]  carvedilol (COREG) 12.5 MG tablet Take 1 tablet (12.5 mg total) by mouth 2 (two) times daily with a meal. 07/07/22   Georgeanna Lea, MD  cetirizine (ZYRTEC) 10 MG tablet Take 10 mg by mouth daily.    [provider]  Cholecalciferol 25 MCG (1000 UT) tablet Take 2,000 Units by mouth daily.    [provider]  Chondroitin Sulfate-Vit C-Mn (CHONDROITIN SULFATE COMPLEX PO) Take 2 tablets by mouth daily. Unknown strength    [provider]  Coenzyme Q10 (CO Q 10) 100 MG CAPS Take 10 mg by mouth daily.    [provider]  ENTRESTO 49-51 MG TAKE 1 TABLET BY MOUTH TWICE A DAY 05/09/23   Georgeanna Lea, MD  MAGNESIUM PO Take 1 tablet by mouth daily. Unknown strength    [provider]  Multiple Vitamins-Minerals (CENTRUM SILVER ULTRA MENS PO) Take 1 tablet by mouth daily. Unknown strength    [provider]  nitroGLYCERIN (NITROSTAT) 0.4 MG SL tablet Place 1 tablet (0.4 mg total) under the tongue every 5 (five) minutes as needed for chest pain. 07/07/22   Georgeanna Lea, MD  spironolactone (ALDACTONE) 25 MG tablet TAKE 1 TABLET (25 MG TOTAL) BY MOUTH DAILY. 05/09/23   Georgeanna Lea, MD  terbinafine (LAMISIL) 250 MG tablet Take 1 tablet (250 mg total) by mouth daily. 08/05/20   Excell Seltzer, MD    Family History Family History  Problem Relation Age of Onset   Heart failure Mother    Colon polyps Neg Hx    Colon cancer Neg Hx    Esophageal cancer Neg Hx    Stomach cancer Neg Hx    Rectal cancer Neg Hx     Social History Social History   Tobacco Use   Smoking status: Never   Smokeless tobacco: Never  Vaping Use   Vaping status: Never Used  Substance Use Topics   Alcohol use: Yes    Comment: rarely   Drug use: No     Allergies   Demerol [meperidine] and Sulfonamide derivatives   Review of Systems Review of Systems   Physical Exam Triage Vital Signs ED Triage Vitals [08/13/23 1420]  Encounter Vitals Group     BP 133/83     Systolic BP Percentile      Diastolic BP Percentile      Pulse Rate (!) 59     Resp 18     Temp 99.1 F (37.3 C)     Temp Source Oral     SpO2 95 %     Weight      Height      Head Circumference      Peak Flow      Pain Score 3     Pain Loc      Pain Education      Exclude from Growth Chart    No data found.  Updated Vital Signs BP 133/83 (BP Location: Left Arm)   Pulse (!) 59   Temp 99.1 F (37.3 C) (Oral)   Resp 18   SpO2 95%   Visual Acuity Right Eye Distance:   Left Eye Distance:   Bilateral  Distance:    Right Eye Near:   Left Eye Near:    Bilateral Near:     Physical Exam Constitutional:      Appearance: Normal appearance. He is well-developed.  HENT:     Head: Normocephalic.     Right  Ear: Tympanic membrane, ear canal and external ear normal.     Left Ear: Ear canal and external ear normal.     Nose: Congestion present.     Mouth/Throat:     Mouth: Mucous membranes are moist.     Pharynx: Oropharynx is clear. No oropharyngeal exudate or posterior oropharyngeal erythema.  Eyes:     Extraocular Movements: Extraocular movements intact.  Cardiovascular:     Rate and Rhythm: Normal rate and regular rhythm.     Pulses: Normal pulses.     Heart sounds: Normal heart sounds.  Pulmonary:     Effort: Pulmonary effort is normal.     Breath sounds: Normal breath sounds.  Musculoskeletal:     Cervical back: Normal range of motion and neck supple.  Neurological:     Mental Status: He is alert and oriented to person, place, and time. Mental status is at baseline.      UC Treatments / Results  Labs (all labs ordered are listed, but only abnormal results are displayed) Labs Reviewed - No data to display  EKG   Radiology No results found.  Procedures Procedures (including critical care time)  Medications Ordered in UC Medications - No data to display  Initial Impression / Assessment and Plan / UC Course  I have reviewed the triage vital signs and the nursing notes.  Pertinent labs & imaging results that were available during my care of the patient were reviewed by me and considered in my medical decision making (see chart for details).  Acute URI  Patient is in no signs of distress nor toxic appearing.  Vital signs are stable.  Low suspicion for pneumonia, pneumothorax or bronchitis and therefore will defer imaging.  Prescribed Augmentin, Tessalon and guaifenesin codeine, PDMP reviewed low risk.May use additional over-the-counter medications as needed for  supportive care.  May follow-up with urgent care as needed if symptoms persist or worsen.    Final Clinical Impressions(s) / UC Diagnoses   Final diagnoses:  Acute URI     Discharge Instructions      Augmentin every morning and every evening for 10 days  May use Tessalon pill every 8 hours as needed for cough, may use cough syrup every 6 hours for additional comfort, be mindful this will make you feel drowsy  You can take Tylenol and/or Ibuprofen as needed for fever reduction and pain relief.   For cough: honey 1/2 to 1 teaspoon (you can dilute the honey in water or another fluid).  You can also use guaifenesin and dextromethorphan for cough. You can use a humidifier for chest congestion and cough.  If you don't have a humidifier, you can sit in the bathroom with the hot shower running.      For sore throat: try warm salt water gargles, cepacol lozenges, throat spray, warm tea or water with lemon/honey, popsicles or ice, or OTC cold relief medicine for throat discomfort.   For congestion: take a daily anti-histamine like Zyrtec, Claritin, and a oral decongestant, such as pseudoephedrine.  You can also use Flonase 1-2 sprays in each nostril daily.   It is important to stay hydrated: drink plenty of fluids (water, gatorade/powerade/pedialyte, juices, or teas) to keep your throat moisturized and help further relieve irritation/discomfort.    ED Prescriptions     Medication Sig Dispense Auth. Provider   amoxicillin-clavulanate (AUGMENTIN) 875-125 MG tablet Take 1 tablet by mouth every 12 (twelve) hours for 10 days. 20 tablet Valinda Hoar, NP   benzonatate (  TESSALON) 100 MG capsule Take 1 capsule (100 mg total) by mouth every 8 (eight) hours. 21 capsule Jevaun Strick R, NP   guaiFENesin-codeine 100-10 MG/5ML syrup Take 5 mLs by mouth every 6 (six) hours as needed for cough. 120 mL Ashvin Adelson, Elita Boone, NP      I have reviewed the PDMP during this encounter.   Valinda Hoar, NP 08/13/23 [phone]

## 2023-09-10 ENCOUNTER — Other Ambulatory Visit: Payer: Self-pay | Admitting: Cardiology

## 2023-11-07 ENCOUNTER — Ambulatory Visit
Admission: EM | Admit: 2023-11-07 | Discharge: 2023-11-07 | Disposition: A | Discharge status: Home / Self Care | Attending: Family Medicine | Admitting: Family Medicine

## 2023-11-07 ENCOUNTER — Ambulatory Visit

## 2023-11-07 DIAGNOSIS — J069 Acute upper respiratory infection, unspecified: Secondary | ICD-10-CM | POA: Diagnosis not present

## 2023-11-07 DIAGNOSIS — R059 Cough, unspecified: Secondary | ICD-10-CM | POA: Diagnosis not present

## 2023-11-07 MED ORDER — HYDROCODONE BIT-HOMATROP MBR 5-1.5 MG/5ML PO SOLN: 5.0000 mL | Freq: Four times a day (QID) | ORAL | 0 refills | Status: DC | PRN

## 2023-11-07 MED ORDER — PREDNISONE 20 MG PO TABS: ORAL_TABLET | ORAL | 0 refills | Status: DC

## 2023-11-07 MED ORDER — AMOXICILLIN-POT CLAVULANATE 875-125 MG PO TABS: 1.0000 | ORAL_TABLET | Freq: Two times a day (BID) | ORAL | 0 refills | Status: DC

## 2023-11-07 NOTE — ED Provider Notes (Signed)
 Lucas Willis [facility]    CSN: [id number] Arrival date & time: 11/07/23  1706      History   Chief Complaint Chief Complaint  Patient presents with   Cough   Nasal Congestion   Sore Throat    HPI Lucas Willis is a 68 y.o. male.   HPI 68 year old male presents with cough, nasal congestion, and sore throat for 9 days. PMH significant for chronic systolic heart failure, cardiomyopathy, and pure hypercholesterolemia.  Past Medical History:  Diagnosis Date   CAD, nonobstructive on 2017 cath 07/19/2017   Cardiomyopathy (HCC) 09/04/2015   Chronic systolic heart failure (HCC) 09/04/2015   COVID    Fatigue 07/19/2017   Heart failure with reduced ejection fraction (HCC) 09/04/2015   Followed by Dr. [facility]. EF 43% on ECHO 07/19/2016  Repeat 2021:  EF40-46%   Leg cramps 05/14/2014   Osteoarthritis of left knee 04/05/2018   Pure hypercholesterolemia 06/04/2014    Patient Active Problem List   Diagnosis Date Noted   Dyslipidemia 07/07/2022   Dupuytren's contracture of right hand 05/14/2022   Seborrheic keratosis 05/14/2022   Nonsustained ventricular tachycardia (HCC) 03/18/2021   Lump of right breast 09/26/2020   S/P left knee arthroscopy 05/20/2020   Osteoarthritis of left knee 04/05/2018   CAD, nonobstructive on 2017 cath 07/19/2017   Cardiomyopathy (HCC) 09/04/2015   Heart failure with reduced ejection fraction (HCC) 09/04/2015   Pure hypercholesterolemia 06/04/2014   Leg cramps 05/14/2014    Past Surgical History:  Procedure Laterality Date   CARDIAC CATHETERIZATION N/A 09/11/2015   Procedure: Right/Left Heart Cath and Coronary Angiography;  Surgeon: Peter M Swaziland, MD;  Location: [facility] INVASIVE CV LAB;  Service: Cardiovascular;  Laterality: N/A;   COLONOSCOPY  2011   MS-F/V-movi(good)-TA-recall 25yrs   CYST REMOVAL LEG Left 1970   back of L knee   HERNIA REPAIR Left 1979   inguinal   KNEE ARTHROSCOPY Left 2021   PLANTAR'S WART EXCISION  1972    POLYPECTOMY  2011   TA   SHOULDER SURGERY Right 2008   Dislocation Repair   TONSILLECTOMY     WISDOM TOOTH EXTRACTION         Home Medications    Prior to Admission medications   Medication Sig Start Date End Date Taking? Authorizing Provider  amoxicillin -clavulanate (AUGMENTIN ) 875-125 MG tablet Take 1 tablet by mouth every 12 (twelve) hours. 11/07/23  Yes Leonides Ramp, FNP  HYDROcodone  bit-homatropine (HYCODAN) 5-1.5 MG/5ML syrup Take 5 mLs by mouth every 6 (six) hours as needed for cough. 11/07/23  Yes Leonides Ramp, FNP  predniSONE (DELTASONE) 20 MG tablet Take 3 tabs PO daily x 5 days. 11/07/23  Yes Leonides Ramp, FNP  Ascorbic Acid (VITAMIN C PO) Take 1 tablet by mouth daily.    [provider]  aspirin  EC (ASPIRIN  LOW DOSE) 81 MG tablet Take 1 tablet (81 mg total) by mouth daily. 05/09/23   Krasowski, Robert J, MD  atorvastatin  (LIPITOR) 80 MG tablet Take 1 tablet (80 mg total) by mouth daily. 06/13/23   Krasowski, Robert J, MD  b complex vitamins tablet Take 1 tablet by mouth daily. Unknown strength    [provider]  BIOTIN PO Take 1 capsule by mouth daily. Unknown strength    [provider]  carvedilol  (COREG ) 12.5 MG tablet TAKE 1 TABLET (12.5MG  TOTAL) BY MOUTH TWICE A DAY WITH MEALS 09/12/23   Krasowski, Robert J, MD  cetirizine (ZYRTEC) 10 MG tablet Take 10 mg by mouth daily.  [provider]  Cholecalciferol 25 MCG (1000 UT) tablet Take 2,000 Units by mouth daily.    [provider]  Chondroitin Sulfate-Vit C-Mn (CHONDROITIN SULFATE COMPLEX PO) Take 2 tablets by mouth daily. Unknown strength    [provider]  Coenzyme Q10 (CO Q 10) 100 MG CAPS Take 10 mg by mouth daily.    [provider]  ENTRESTO  49-51 MG TAKE 1 TABLET BY MOUTH TWICE A DAY 05/09/23   Krasowski, Robert J, MD  MAGNESIUM PO Take 1 tablet by mouth daily. Unknown strength    [provider]  Multiple Vitamins-Minerals (CENTRUM SILVER  ULTRA MENS PO) Take 1 tablet by mouth daily. Unknown strength    [provider]  nitroGLYCERIN  (NITROSTAT ) 0.4 MG SL tablet Place 1 tablet (0.4 mg total) under the tongue every 5 (five) minutes as needed for chest pain. 07/07/22   Krasowski, Robert J, MD  spironolactone  (ALDACTONE ) 25 MG tablet TAKE 1 TABLET (25 MG TOTAL) BY MOUTH DAILY. 05/09/23   Krasowski, Robert J, MD  terbinafine  (LAMISIL ) 250 MG tablet Take 1 tablet (250 mg total) by mouth daily. 08/05/20   Judithann Novas, MD    Family History Family History  Problem Relation Age of Onset   Heart failure Mother    Colon polyps Neg Hx    Colon cancer Neg Hx    Esophageal cancer Neg Hx    Stomach cancer Neg Hx    Rectal cancer Neg Hx     Social History Social History   Tobacco Use   Smoking status: Never   Smokeless tobacco: Never  Vaping Use   Vaping status: Never Used  Substance Use Topics   Alcohol use: Yes    Comment: rarely   Drug use: No     Allergies   Demerol [meperidine] and Sulfonamide derivatives   Review of Systems Review of Systems  HENT:  Positive for congestion and sore throat.   Respiratory:  Positive for cough.   All other systems reviewed and are negative.    Physical Exam Triage Vital Signs ED Triage Vitals  Encounter Vitals Group     BP      Systolic BP Percentile      Diastolic BP Percentile      Pulse      Resp      Temp      Temp src      SpO2      Weight      Height      Head Circumference      Peak Flow      Pain Score      Pain Loc      Pain Education      Exclude from Growth Chart    No data found.  Updated Vital Signs BP 118/73 (BP Location: Right Arm)   Pulse 83   Temp 98.9 F (37.2 C) (Oral)   Resp 17   SpO2 94%    Physical Exam Vitals and nursing note reviewed.  Constitutional:      Appearance: Normal appearance. He is normal weight.  HENT:     Head: Normocephalic and atraumatic.     Mouth/Throat:     Mouth: Mucous membranes are moist.      Pharynx: Oropharynx is clear.  Eyes:     Extraocular Movements: Extraocular movements intact.     Conjunctiva/sclera: Conjunctivae normal.     Pupils: Pupils are equal, round, and reactive to light.  Cardiovascular:  Rate and Rhythm: Normal rate and regular rhythm.     Pulses: Normal pulses.     Heart sounds: Normal heart sounds.  Pulmonary:     Effort: Pulmonary effort is normal.     Breath sounds: No wheezing, rhonchi or rales.     Comments: Diminished breath sounds noted throughout, frequent nonproductive cough on exam Musculoskeletal:        General: Normal range of motion.  Skin:    General: Skin is warm and dry.  Neurological:     General: No focal deficit present.     Mental Status: He is alert and oriented to person, place, and time. Mental status is at baseline.  Psychiatric:        Mood and Affect: Mood normal.      UC Treatments / Results  Labs (all labs ordered are listed, but only abnormal results are displayed) Labs Reviewed - No data to display  EKG   Radiology DG Chest 2 View Result Date: 11/07/2023 CLINICAL DATA:  Cough for 9 days. Adventitious breath sounds on examination. EXAM: CHEST - 2 VIEW COMPARISON:  Radiographs 10/31/2019 and 04/02/2018. FINDINGS: The heart size and mediastinal contours are normal. The lungs are clear. There is no pleural effusion or pneumothorax. No acute osseous findings are identified. IMPRESSION: No evidence of acute cardiopulmonary process. Electronically Signed   By: Elmon Hagedorn M.D.   On: 11/07/2023 18:35    Procedures Procedures (including critical care time)  Medications Ordered in UC Medications - No data to display  Initial Impression / Assessment and Plan / UC Course  I have reviewed the triage vital signs and the nursing notes.  Pertinent labs & imaging results that were available during my care of the patient were reviewed by me and considered in my medical decision making (see chart for details).      MDM: 1.  Acute URI-Rx'd Augmentin  875/125 mg tablet: Take 1 tablet twice daily x 7 days; 2.  Cough, unspecified type-CXR revealed above, Rx'd prednisone 20 mg tablet: Take 3 tablets p.o. daily x 5 days, Rx'd Hycodan 5-1.5 mg / 5 mL syrup: Take 5 mL every 6 hours, as needed for cough. Advised patient of chest x-ray results with hardcopy and images provided.  Advised patient take medications as directed with food to completion.  Advised patient to take prednisone with first dose of Augmentin  for the next 5 of 7 days.  Advised may use Hycodan cough syrup at night prior to sleep for cough due to sedative  effects.  Encouraged to increase daily water intake to 64 ounces per day while taking these medications.  Advised if symptoms worsen and/or unresolved please follow-up with your PCP or here for further evaluation.  Patient discharged home, hemodynamically stable. Final Clinical Impressions(s) / UC Diagnoses   Final diagnoses:  Cough, unspecified type  Acute URI     Discharge Instructions      Advised patient of chest x-ray results with hardcopy and images provided.  Advised patient take medications as directed with food to completion.  Advised patient to take prednisone with first dose of Augmentin  for the next 5 of 7 days.  Advised may use Hycodan cough syrup at night prior to sleep for cough due to sedative  effects.  Encouraged to increase daily water intake to 64 ounces per day while taking these medications.  Advised if symptoms worsen and/or unresolved please follow-up with your PCP or here for further evaluation.   ED Prescriptions  Medication Sig Dispense Auth. Provider   amoxicillin -clavulanate (AUGMENTIN ) 875-125 MG tablet Take 1 tablet by mouth every 12 (twelve) hours. 14 tablet Herman Fiero, FNP   predniSONE (DELTASONE) 20 MG tablet Take 3 tabs PO daily x 5 days. 15 tablet Rodrigo Mcgranahan, FNP   HYDROcodone  bit-homatropine (HYCODAN) 5-1.5 MG/5ML syrup Take 5 mLs by mouth every 6  (six) hours as needed for cough. 120 mL Mel Tadros, FNP      PDMP not reviewed this encounter.   Leonides Ramp, FNP 11/07/23 1849

## 2023-11-07 NOTE — Discharge Instructions (Addendum)
 Advised patient of chest x-ray results with hardcopy and images provided.  Advised patient take medications as directed with food to completion.  Advised patient to take prednisone with first dose of Augmentin  for the next 5 of 7 days.  Advised may use Hycodan cough syrup at night prior to sleep for cough due to sedative  effects.  Encouraged to increase daily water intake to 64 ounces per day while taking these medications.  Advised if symptoms worsen and/or unresolved please follow-up with your PCP or here for further evaluation.

## 2023-11-07 NOTE — ED Triage Notes (Signed)
 Pt c/o cough, congestion and sore throat x 9 days. Taking mucinex  DM.

## 2023-11-23 ENCOUNTER — Other Ambulatory Visit: Payer: Self-pay | Admitting: Cardiology

## 2023-11-25 ENCOUNTER — Ambulatory Visit: Admitting: Podiatry

## 2023-11-25 ENCOUNTER — Encounter: Payer: Self-pay | Admitting: Podiatry

## 2023-11-25 ENCOUNTER — Ambulatory Visit (INDEPENDENT_AMBULATORY_CARE_PROVIDER_SITE_OTHER)

## 2023-11-25 ENCOUNTER — Telehealth: Payer: Self-pay | Admitting: Podiatry

## 2023-11-25 DIAGNOSIS — M7989 Other specified soft tissue disorders: Secondary | ICD-10-CM

## 2023-11-25 DIAGNOSIS — M7671 Peroneal tendinitis, right leg: Secondary | ICD-10-CM | POA: Diagnosis not present

## 2023-11-25 MED ORDER — MELOXICAM 15 MG PO TABS: 15.0000 mg | ORAL_TABLET | Freq: Every day | ORAL | 2 refills | Status: DC

## 2023-11-25 MED ORDER — TRIAMCINOLONE ACETONIDE 10 MG/ML IJ SUSP: 10.0000 mg | Freq: Once | INTRAMUSCULAR | Status: DC

## 2023-11-25 NOTE — Telephone Encounter (Signed)
 Patient called in regards to meloxicam prescription. Pharmacist noted that the drug contains sulfa which patient has an allergy to. Patient wanting to know if this is ok to take or if there is an alternative. Please advise thanks!

## 2023-11-26 NOTE — Progress Notes (Signed)
 Subjective:   Patient ID: Lucas Willis, male   DOB: 68 y.o.   MRN: [id number]   HPI Patient presents with swelling of his right foot and also on the outside has a lot of pain with throbbing shooting pains that have been bothersome.  He does take Tylenol  as needed for the pain.  Patient does not smoke likes to be active   Review of Systems  All other systems reviewed and are negative.       Objective:  Physical Exam Vitals and nursing note reviewed.  Constitutional:      Appearance: He is well-developed.  Pulmonary:     Effort: Pulmonary effort is normal.   Musculoskeletal:        General: Normal range of motion.   Skin:    General: Skin is warm.   Neurological:     Mental Status: He is alert.     Neurovascular status was found to be intact muscle strength found to be adequate range of motion adequate with exquisite discomfort lateral side right foot at the peroneal insertion into the fifth metatarsal base.  Upon testing and strength I did not note any pathology of the tendon currently and appears to be an inflammatory condition.  Good digital perfusion well-oriented x 3     Assessment:  Peroneal tendinitis right with inflammation fluid buildup at insertion     Plan:  H&P condition reviewed today I went ahead discussed injection explaining risk sterile prep and injected carefully the sheath of the peroneal tendon 3 mg dexamethasone Kenalog  5 mg Xylocaine  at insertion  X-rays today did not indicate any signs of fracture or pathology around the fifth metatarsal base.  Mild arthritis foot nothing to be concerned about at this time

## 2023-11-28 ENCOUNTER — Telehealth: Payer: Self-pay | Admitting: Podiatry

## 2023-11-28 NOTE — Telephone Encounter (Signed)
 He can hold off currently

## 2023-11-28 NOTE — Telephone Encounter (Signed)
 Patient called to inform provider the medication prescribed, he is allergic to sulfur and he also wanted provider to know he has cardiomyopathy.

## 2023-11-28 NOTE — Telephone Encounter (Signed)
 I just used antiinflammatory. He doesn't have to take

## 2023-11-29 NOTE — Telephone Encounter (Signed)
 Patient requests a new script for pain that doesn't contain sulfur .He is hurting

## 2023-11-30 ENCOUNTER — Other Ambulatory Visit: Payer: Self-pay | Admitting: Podiatry

## 2023-11-30 ENCOUNTER — Telehealth: Payer: Self-pay | Admitting: Podiatry

## 2023-11-30 MED ORDER — DICLOFENAC SODIUM 75 MG PO TBEC: 75.0000 mg | DELAYED_RELEASE_TABLET | Freq: Two times a day (BID) | ORAL | 2 refills | Status: AC

## 2023-11-30 NOTE — Telephone Encounter (Signed)
 Switched him to voltaren 

## 2023-11-30 NOTE — Telephone Encounter (Signed)
 Patient initially stated that he called several days ago regarding a sulfa allergy related to the medication prescribed on 6/27. Upon review of the chart, a message from [facility] was documented on 7/2.  When informed of this, the patient became immediately frustrated and stated the following:  He had not been contacted since 6/27, despite leaving a message. Although he did see the doctor's message, he had called back to report that the medication contained a sulfa derivative and he wanted an alternative for pain and still received no call back  He was under the impression that a prescription would be provided and expressed frustration at being told to take OTC medication instead. He stated he wanted a prescription for his pain.  He then stated: "Just note that I will not be coming back here. The doctor was great, but the staff suck," and disconnected the call.

## 2023-12-28 ENCOUNTER — Other Ambulatory Visit: Payer: Self-pay | Admitting: Family Medicine

## 2023-12-28 ENCOUNTER — Telehealth: Payer: Self-pay | Admitting: *Deleted

## 2023-12-28 DIAGNOSIS — E7800 Pure hypercholesterolemia, unspecified: Secondary | ICD-10-CM

## 2023-12-28 DIAGNOSIS — Z125 Encounter for screening for malignant neoplasm of prostate: Secondary | ICD-10-CM

## 2023-12-28 NOTE — Telephone Encounter (Signed)
 Please call and schedule fasting labs appointment prior to hsi CPE next week.  Future orders in Epic.

## 2023-12-28 NOTE — Telephone Encounter (Signed)
 Copied from CRM [id number]. Topic: Clinical - Request for Lab/Test Order >> Dec 28, 2023 11:12 AM Rea BROCKS wrote: Reason for CRM: Patient is scheduled for a physical. Patient would like to put in a request for blood work to have labs done before his physical appointment next Friday.   [phone] (M)

## 2023-12-29 NOTE — Telephone Encounter (Signed)
 Copied from CRM #[id number]. Topic: Appointments - Scheduling Inquiry for Clinic >> Dec 29, 2023 10:51 AM [name] wrote: Reason for CRM: Pt called to book his wellness visit on Aug 14th but would like to get his blood work done before that and would like to check if anything else is needed from him before his wellness appt, please call pt back ASAP

## 2023-12-29 NOTE — Telephone Encounter (Signed)
 Spoke to pt, pt stated he'd need to r/s his cpe from 8/8 to another date, due to having another appt around same time as cpe. Pt stated he'd cb to r/s cpe & sch labs.

## 2023-12-29 NOTE — Telephone Encounter (Signed)
 Please call and schedule fasting labs appointment prior to hsi CPE next week.  Future orders in Epic.

## 2023-12-29 NOTE — Telephone Encounter (Signed)
 Spoke to pt, sch labs 01/05/24

## 2024-01-05 ENCOUNTER — Ambulatory Visit: Payer: Self-pay | Admitting: Family Medicine

## 2024-01-05 ENCOUNTER — Other Ambulatory Visit (INDEPENDENT_AMBULATORY_CARE_PROVIDER_SITE_OTHER)

## 2024-01-05 DIAGNOSIS — Z125 Encounter for screening for malignant neoplasm of prostate: Secondary | ICD-10-CM

## 2024-01-05 LAB — COMPREHENSIVE METABOLIC PANEL WITH GFR
ALT: 21 U/L (ref 0–53)
AST: 20 U/L (ref 0–37)
Albumin: 3.8 g/dL (ref 3.5–5.2)
Alkaline Phosphatase: 63 U/L (ref 39–117)
BUN: 15 mg/dL (ref 6–23)
CO2: 28 meq/L (ref 19–32)
Calcium: 9.2 mg/dL (ref 8.4–10.5)
Chloride: 104 meq/L (ref 96–112)
Creatinine, Ser: 0.71 mg/dL (ref 0.40–1.50)
GFR: 94.83 mL/min (ref >60.00)
Glucose, Bld: 98 mg/dL (ref 70–99)
Potassium: 4.5 meq/L (ref 3.5–5.1)
Sodium: 144 meq/L (ref 135–145)
Total Bilirubin: 0.4 mg/dL (ref 0.2–1.2)
Total Protein: 6 g/dL (ref 6.0–8.3)

## 2024-01-05 LAB — LIPID PANEL
Cholesterol: 159 mg/dL (ref 0–200)
HDL: 39.5 mg/dL (ref >39.00)
LDL Cholesterol: 102 mg/dL — ABNORMAL HIGH (ref 0–99)
NonHDL: 119.83
Total CHOL/HDL Ratio: 4
Triglycerides: 90 mg/dL (ref 0.0–149.0)
VLDL: 18 mg/dL (ref 0.0–40.0)

## 2024-01-05 LAB — PSA: PSA: 0.83 ng/mL (ref 0.10–4.00)

## 2024-01-05 NOTE — Progress Notes (Signed)
 No critical labs need to be addressed urgently. We will discuss labs in detail at upcoming office visit.

## 2024-01-06 ENCOUNTER — Encounter: Admitting: Family Medicine

## 2024-01-06 DIAGNOSIS — B078 Other viral warts: Secondary | ICD-10-CM | POA: Diagnosis not present

## 2024-01-06 DIAGNOSIS — L570 Actinic keratosis: Secondary | ICD-10-CM | POA: Diagnosis not present

## 2024-01-06 DIAGNOSIS — X32XXXD Exposure to sunlight, subsequent encounter: Secondary | ICD-10-CM | POA: Diagnosis not present

## 2024-01-06 DIAGNOSIS — Z1283 Encounter for screening for malignant neoplasm of skin: Secondary | ICD-10-CM | POA: Diagnosis not present

## 2024-01-06 DIAGNOSIS — D225 Melanocytic nevi of trunk: Secondary | ICD-10-CM | POA: Diagnosis not present

## 2024-01-09 ENCOUNTER — Ambulatory Visit
Admission: EM | Admit: 2024-01-09 | Discharge: 2024-01-09 | Disposition: A | Discharge status: Home / Self Care | Attending: Internal Medicine | Admitting: Internal Medicine

## 2024-01-09 ENCOUNTER — Other Ambulatory Visit: Payer: Self-pay

## 2024-01-09 DIAGNOSIS — R051 Acute cough: Secondary | ICD-10-CM | POA: Diagnosis not present

## 2024-01-09 DIAGNOSIS — J209 Acute bronchitis, unspecified: Secondary | ICD-10-CM | POA: Diagnosis not present

## 2024-01-09 LAB — POC SARS CORONAVIRUS 2 AG -  ED: SARS Coronavirus 2 Ag: NEGATIVE

## 2024-01-09 MED ORDER — PREDNISONE 20 MG PO TABS: 40.0000 mg | ORAL_TABLET | Freq: Every day | ORAL | 0 refills | Status: AC

## 2024-01-09 MED ORDER — PROMETHAZINE-DM 6.25-15 MG/5ML PO SYRP: 5.0000 mL | ORAL_SOLUTION | Freq: Every evening | ORAL | 0 refills | Status: DC | PRN

## 2024-01-09 MED ORDER — METHYLPREDNISOLONE SODIUM SUCC 125 MG IJ SOLR
80.0000 mg | Freq: Once | INTRAMUSCULAR | Status: AC
  Administered 2024-01-09 (×2): 80 mg via INTRAMUSCULAR

## 2024-01-09 MED ORDER — AEROCHAMBER PLUS FLO-VU MEDIUM MISC
1.0000 | Freq: Once | Status: AC
  Administered 2024-01-09 (×2): 1

## 2024-01-09 MED ORDER — ALBUTEROL SULFATE HFA 108 (90 BASE) MCG/ACT IN AERS
2.0000 | INHALATION_SPRAY | Freq: Once | RESPIRATORY_TRACT | Status: AC
  Administered 2024-01-09 (×2): 2 via RESPIRATORY_TRACT

## 2024-01-09 MED ORDER — PREDNISONE 20 MG PO TABS: 40.0000 mg | ORAL_TABLET | Freq: Every day | ORAL | 0 refills | Status: DC

## 2024-01-09 NOTE — Discharge Instructions (Addendum)
 You have bronchitis which is inflammation of the upper airways in your lungs due to a virus.   I gave you a steroid shot in the clinic to help with your wheezing and inflammation to the chest. Start taking prednisone  tomorrow.  Take 2 pills of prednisone  once daily (40 mg total daily) for the next 5 mornings with breakfast.  Take with food to avoid stomach upset. Do not take any ibuprofen, naproxen, Aleve, aspirin , etc. when taking prednisone .  Use albuterol  2 puffs with a spacer every 4-6 hours as needed for cough, shortness of breath, and wheezing.  Rinse out your mouth after using albuterol .  Use guaifenesin  (plain mucinex ) to break up congestion in nose/chest so that you are able to excrete easier. Drink plenty of fluids to stay well hydrated while taking mucinex  so that it works well in the body.   Promethazine  DM cough syrup at bedtime as needed.  This will make you sleepy so only use it at bedtime.  If you develop any new or worsening symptoms or if your symptoms do not start to improve, please return here or follow-up with your primary care provider. If your symptoms are severe, please go to the emergency room.

## 2024-01-09 NOTE — ED Provider Notes (Signed)
 [facility]    CSN: [id number] Arrival date & time: 01/09/24  1832      History   Chief Complaint Chief Complaint  Patient presents with   Cough    HPI Lucas Willis is a 68 y.o. male.   Lucas Willis is a 68 y.o. male presenting for chief complaint of cough, chest congestion, diarrhea, generalized fatigue, and fever/chills that started 2 days ago.  Patient dropped off his son at [facility] and believes that he may have caught illness there.  Cough is dry, nonproductive, and worse at nighttime.  He reports intermittent shortness of breath and bilateral chest tightness associated with coughing.  Denies leg swelling, orthopnea, heart palpitations, nausea, vomiting, abdominal pain, and rash.  He reports watery diarrhea for the last 24 hours.  No recent travel, intake of foods outside of normal diet, or recent antibiotic or steroid use reported.  History of congestive heart failure with preserved ejection fraction (EF most recently 55 to 60% on echo from October 2024).  Never smoker, denies illicit drug use.  Denies history of asthma or COPD.  Taking Mucinex  DM with minimal relief.   Cough   Past Medical History:  Diagnosis Date   CAD, nonobstructive on 2017 cath 07/19/2017   Cardiomyopathy (HCC) 09/04/2015   Chronic systolic heart failure (HCC) 09/04/2015   COVID    Fatigue 07/19/2017   Heart failure with reduced ejection fraction (HCC) 09/04/2015   Followed by Dr. Bernie Cardiology. EF 43% on ECHO 07/19/2016  Repeat 2021:  EF40-46%   Leg cramps 05/14/2014   Osteoarthritis of left knee 04/05/2018   Pure hypercholesterolemia 06/04/2014    Patient Active Problem List   Diagnosis Date Noted   Dyslipidemia 07/07/2022   Dupuytren's contracture of right hand 05/14/2022   Seborrheic keratosis 05/14/2022   Nonsustained ventricular tachycardia (HCC) 03/18/2021   Lump of right breast 09/26/2020   S/P left knee arthroscopy 05/20/2020    Osteoarthritis of left knee 04/05/2018   CAD, nonobstructive on 2017 cath 07/19/2017   Cardiomyopathy (HCC) 09/04/2015   Heart failure with reduced ejection fraction (HCC) 09/04/2015   Pure hypercholesterolemia 06/04/2014   Leg cramps 05/14/2014    Past Surgical History:  Procedure Laterality Date   CARDIAC CATHETERIZATION N/A 09/11/2015   Procedure: Right/Left Heart Cath and Coronary Angiography;  Surgeon: Peter M Swaziland, MD;  Location: [facility] INVASIVE CV LAB;  Service: Cardiovascular;  Laterality: N/A;   COLONOSCOPY  2011   MS-F/V-movi(good)-TA-recall 70yrs   CYST REMOVAL LEG Left 1970   back of L knee   HERNIA REPAIR Left 1979   inguinal   KNEE ARTHROSCOPY Left 2021   PLANTAR'S WART EXCISION  1972   POLYPECTOMY  2011   TA   SHOULDER SURGERY Right 2008   Dislocation Repair   TONSILLECTOMY     WISDOM TOOTH EXTRACTION         Home Medications    Prior to Admission medications   Medication Sig Start Date End Date Taking? Authorizing Provider  predniSONE  (DELTASONE ) 20 MG tablet Take 2 tablets (40 mg total) by mouth daily with breakfast for 5 days. 01/09/24 01/14/24 Yes StanhopeDorna HERO, FNP  promethazine -dextromethorphan (PROMETHAZINE -DM) 6.25-15 MG/5ML syrup Take 5 mLs by mouth at bedtime as needed for cough. 01/09/24  Yes Enedelia Dorna HERO, FNP  amoxicillin -clavulanate (AUGMENTIN ) 875-125 MG tablet Take 1 tablet by mouth every 12 (twelve) hours. 11/07/23   Teddy Sharper, FNP  Ascorbic Acid (VITAMIN C PO) Take 1 tablet by mouth  daily.    [provider]  aspirin  EC (ASPIRIN  LOW DOSE) 81 MG tablet TAKE 1 TABLET BY MOUTH EVERY DAY 11/23/23   Krasowski, Robert J, MD  atorvastatin  (LIPITOR) 80 MG tablet Take 1 tablet (80 mg total) by mouth daily. 06/13/23   Krasowski, Robert J, MD  b complex vitamins tablet Take 1 tablet by mouth daily. Unknown strength    [provider]  BIOTIN PO Take 1 capsule by mouth daily. Unknown strength    [provider]   carvedilol  (COREG ) 12.5 MG tablet TAKE 1 TABLET (12.5MG  TOTAL) BY MOUTH TWICE A DAY WITH MEALS 09/12/23   Krasowski, Robert J, MD  cetirizine (ZYRTEC) 10 MG tablet Take 10 mg by mouth daily.    [provider]  Cholecalciferol 25 MCG (1000 UT) tablet Take 2,000 Units by mouth daily.    [provider]  Chondroitin Sulfate-Vit C-Mn (CHONDROITIN SULFATE COMPLEX PO) Take 2 tablets by mouth daily. Unknown strength    [provider]  Coenzyme Q10 (CO Q 10) 100 MG CAPS Take 10 mg by mouth daily.    [provider]  diclofenac  (VOLTAREN ) 75 MG EC tablet Take 1 tablet (75 mg total) by mouth 2 (two) times daily. 11/30/23   Magdalen Pasco RAMAN, DPM  ENTRESTO  49-51 MG TAKE 1 TABLET BY MOUTH TWICE A DAY 05/09/23   Krasowski, Robert J, MD  HYDROcodone  bit-homatropine [facility]) 5-1.5 MG/5ML syrup Take 5 mLs by mouth every 6 (six) hours as needed for cough. 11/07/23   Teddy Sharper, FNP  MAGNESIUM PO Take 1 tablet by mouth daily. Unknown strength    [provider]  meloxicam  (MOBIC ) 15 MG tablet Take 1 tablet (15 mg total) by mouth daily. 11/25/23   Magdalen Pasco RAMAN, DPM  Multiple Vitamins-Minerals (CENTRUM SILVER ULTRA MENS PO) Take 1 tablet by mouth daily. Unknown strength    [provider]  nitroGLYCERIN  (NITROSTAT ) 0.4 MG SL tablet Place 1 tablet (0.4 mg total) under the tongue every 5 (five) minutes as needed for chest pain. 07/07/22   Krasowski, Robert J, MD  spironolactone  (ALDACTONE ) 25 MG tablet TAKE 1 TABLET (25 MG TOTAL) BY MOUTH DAILY. 11/23/23   Krasowski, Robert J, MD  terbinafine  (LAMISIL ) 250 MG tablet Take 1 tablet (250 mg total) by mouth daily. 08/05/20   Avelina Greig BRAVO, MD    Family History Family History  Problem Relation Age of Onset   Heart failure Mother    Colon polyps Neg Hx    Colon cancer Neg Hx    Esophageal cancer Neg Hx    Stomach cancer Neg Hx    Rectal cancer Neg Hx     Social History Social History   Tobacco Use   Smoking  status: Never   Smokeless tobacco: Never  Vaping Use   Vaping status: Never Used  Substance Use Topics   Alcohol use: Yes    Comment: rarely   Drug use: No     Allergies   Demerol [meperidine] and Sulfonamide derivatives   Review of Systems Review of Systems  Respiratory:  Positive for cough.   Per HPI   Physical Exam Triage Vital Signs ED Triage Vitals  Encounter Vitals Group     BP 01/09/24 1835 (!) 147/87     Girls Systolic BP Percentile --      Girls Diastolic BP Percentile --      Boys Systolic BP Percentile --      Boys Diastolic BP Percentile --  Pulse Rate 01/09/24 1835 (!) 104     Resp 01/09/24 1835 18     Temp 01/09/24 1835 99 F (37.2 C)     Temp src --      SpO2 01/09/24 1835 94 %     Weight --      Height --      Head Circumference --      Peak Flow --      Pain Score 01/09/24 1841 0     Pain Loc --      Pain Education --      Exclude from Growth Chart --    No data found.  Updated Vital Signs BP (!) 147/87   Pulse (!) 104   Temp 99 F (37.2 C)   Resp 18   SpO2 94%   Visual Acuity Right Eye Distance:   Left Eye Distance:   Bilateral Distance:    Right Eye Near:   Left Eye Near:    Bilateral Near:     Physical Exam Vitals and nursing note reviewed.  Constitutional:      Appearance: He is not ill-appearing or toxic-appearing.  HENT:     Head: Normocephalic and atraumatic.     Right Ear: Hearing, tympanic membrane, ear canal and external ear normal.     Left Ear: Hearing, tympanic membrane, ear canal and external ear normal.     Nose: Congestion present.     Mouth/Throat:     Lips: Pink.     Mouth: Mucous membranes are moist. No injury or oral lesions.     Dentition: Normal dentition.     Tongue: No lesions.     Pharynx: Oropharynx is clear. Uvula midline. Posterior oropharyngeal erythema present. No pharyngeal swelling, oropharyngeal exudate, uvula swelling or postnasal drip.     Tonsils: No tonsillar exudate.  Eyes:      General: Lids are normal. Vision grossly intact. Gaze aligned appropriately.     Extraocular Movements: Extraocular movements intact.     Conjunctiva/sclera: Conjunctivae normal.  Neck:     Trachea: Trachea and phonation normal.  Cardiovascular:     Rate and Rhythm: Normal rate and regular rhythm.     Heart sounds: Normal heart sounds, S1 normal and S2 normal.  Pulmonary:     Effort: Pulmonary effort is normal. No respiratory distress.     Breath sounds: Normal air entry. Wheezing (Expiratory wheezing and decreased lung sounds to the bilateral lower lungs.) present. No rhonchi or rales.     Comments: Speaks in full sentences without difficulty or increased work of breathing. Chest:     Chest wall: No tenderness.  Musculoskeletal:     Cervical back: Neck supple.     Right lower leg: No edema.     Left lower leg: No edema.  Lymphadenopathy:     Cervical: No cervical adenopathy.  Skin:    General: Skin is warm and dry.     Capillary Refill: Capillary refill takes less than 2 seconds.     Findings: No rash.  Neurological:     General: No focal deficit present.     Mental Status: He is alert and oriented to person, place, and time. Mental status is at baseline.     Cranial Nerves: No dysarthria or facial asymmetry.  Psychiatric:        Mood and Affect: Mood normal.        Speech: Speech normal.        Behavior: Behavior normal.  Thought Content: Thought content normal.        Judgment: Judgment normal.      UC Treatments / Results  Labs (all labs ordered are listed, but only abnormal results are displayed) Labs Reviewed  POC SARS CORONAVIRUS 2 AG -  ED    EKG   Radiology No results found.  Procedures Procedures (including critical care time)  Medications Ordered in UC Medications  albuterol  (VENTOLIN  HFA) 108 (90 Base) MCG/ACT inhaler 2 puff (2 puffs Inhalation Given 01/09/24 1920)  AeroChamber Plus Flo-Vu Medium MISC 1 each (1 each Other Given 01/09/24 1920)   methylPREDNISolone  sodium succinate (SOLU-MEDROL ) 125 mg/2 mL injection 80 mg (80 mg Intramuscular Given 01/09/24 1921)    Initial Impression / Assessment and Plan / UC Course  I have reviewed the triage vital signs and the nursing notes.  Pertinent labs & imaging results that were available during my care of the patient were reviewed by me and considered in my medical decision making (see chart for details).   1.  Acute cough, acute bronchitis Presentation consistent with acute viral bronchitis. Will treat with steroid, bronchodilator, cough suppressants for symptomatic relief, and expectorants (mucinex ) as needed- see AVS.  Low suspicion for CHF exacerbation.  Most recent EF 55 to 60% (October 2024). Normal renal function on most recent CMP from 12-2023 with creatinine 0.71 and GFR 94.  Interventions in clinic: 2 puffs albuterol  inhaler with spacer and 80 mg IM Solu-Medrol  with some improvement in wheezing prior to discharge.  Patient non-toxic in appearance, vital signs hemodynamically stable, no new oxygen requirement, therefore deferred imaging of the chest. Low suspicion for acute cardiopulmonary abnormality based on history and PE.  Strep/Viral testing: COVID-19 testing is negative.  Counseled patient on potential for adverse effects with medications prescribed/recommended today, strict ER and return-to-clinic precautions discussed, patient verbalized understanding.    Final Clinical Impressions(s) / UC Diagnoses   Final diagnoses:  Acute cough  Acute bronchitis, unspecified organism     Discharge Instructions      You have bronchitis which is inflammation of the upper airways in your lungs due to a virus.   I gave you a steroid shot in the clinic to help with your wheezing and inflammation to the chest. Start taking prednisone  tomorrow.  Take 2 pills of prednisone  once daily (40 mg total daily) for the next 5 mornings with breakfast.  Take with food to avoid stomach  upset. Do not take any ibuprofen, naproxen, Aleve, aspirin , etc. when taking prednisone .  Use albuterol  2 puffs with a spacer every 4-6 hours as needed for cough, shortness of breath, and wheezing.  Rinse out your mouth after using albuterol .  Use guaifenesin  (plain mucinex ) to break up congestion in nose/chest so that you are able to excrete easier. Drink plenty of fluids to stay well hydrated while taking mucinex  so that it works well in the body.   Promethazine  DM cough syrup at bedtime as needed.  This will make you sleepy so only use it at bedtime.  If you develop any new or worsening symptoms or if your symptoms do not start to improve, please return here or follow-up with your primary care provider. If your symptoms are severe, please go to the emergency room.      ED Prescriptions     Medication Sig Dispense Auth. Provider   promethazine -dextromethorphan (PROMETHAZINE -DM) 6.25-15 MG/5ML syrup Take 5 mLs by mouth at bedtime as needed for cough. 118 mL Enedelia Dorna HERO, FNP   predniSONE  (  DELTASONE ) 20 MG tablet Take 2 tablets (40 mg total) by mouth daily with breakfast for 5 days. 10 tablet Enedelia Dorna HERO, FNP      PDMP not reviewed this encounter.   Enedelia Dorna HERO, [place] 01/09/24 1939

## 2024-01-09 NOTE — ED Triage Notes (Signed)
 X 2 days has had cough, runny nose, diarrhea. Taking tylenol  prn. Did not take temperature but has felt warm.

## 2024-01-12 ENCOUNTER — Ambulatory Visit (INDEPENDENT_AMBULATORY_CARE_PROVIDER_SITE_OTHER)
Admission: RE | Admit: 2024-01-12 | Discharge: 2024-01-12 | Disposition: A | Discharge status: Home / Self Care | Source: Ambulatory Visit | Attending: Family Medicine | Admitting: Family Medicine

## 2024-01-12 ENCOUNTER — Ambulatory Visit (INDEPENDENT_AMBULATORY_CARE_PROVIDER_SITE_OTHER): Admitting: Family Medicine

## 2024-01-12 ENCOUNTER — Ambulatory Visit: Payer: Self-pay | Admitting: Family Medicine

## 2024-01-12 ENCOUNTER — Encounter: Payer: Self-pay | Admitting: Family Medicine

## 2024-01-12 VITALS — BP 110/70 | HR 69 | Temp 98.2°F | Ht 68.0 in | Wt 204.2 lb

## 2024-01-12 DIAGNOSIS — Z0000 Encounter for general adult medical examination without abnormal findings: Secondary | ICD-10-CM

## 2024-01-12 DIAGNOSIS — R051 Acute cough: Secondary | ICD-10-CM

## 2024-01-12 DIAGNOSIS — E7800 Pure hypercholesterolemia, unspecified: Secondary | ICD-10-CM | POA: Diagnosis not present

## 2024-01-12 DIAGNOSIS — I420 Dilated cardiomyopathy: Secondary | ICD-10-CM

## 2024-01-12 DIAGNOSIS — R5383 Other fatigue: Secondary | ICD-10-CM

## 2024-01-12 DIAGNOSIS — R059 Cough, unspecified: Secondary | ICD-10-CM | POA: Diagnosis not present

## 2024-01-12 DIAGNOSIS — I5020 Unspecified systolic (congestive) heart failure: Secondary | ICD-10-CM | POA: Diagnosis not present

## 2024-01-12 LAB — CBC WITH DIFFERENTIAL/PLATELET
Basophils Absolute: 0 K/uL (ref 0.0–0.1)
Basophils Relative: 0.2 % (ref 0.0–3.0)
Eosinophils Absolute: 0 K/uL (ref 0.0–0.7)
Eosinophils Relative: 0.3 % (ref 0.0–5.0)
HCT: 39.6 % (ref 39.0–52.0)
Hemoglobin: 13.4 g/dL (ref 13.0–17.0)
Lymphocytes Relative: 28.1 % (ref 12.0–46.0)
Lymphs Abs: 1.7 K/uL (ref 0.7–4.0)
MCHC: 34 g/dL (ref 30.0–36.0)
MCV: 97.3 fl (ref 78.0–100.0)
Monocytes Absolute: 0.4 K/uL (ref 0.1–1.0)
Monocytes Relative: 6.3 % (ref 3.0–12.0)
Neutro Abs: 3.9 K/uL (ref 1.4–7.7)
Neutrophils Relative %: 65.1 % (ref 43.0–77.0)
Platelets: 217 K/uL (ref 150.0–400.0)
RBC: 4.06 Mil/uL — ABNORMAL LOW (ref 4.22–5.81)
RDW: 14.3 % (ref 11.5–15.5)
WBC: 6 K/uL (ref 4.0–10.5)

## 2024-01-12 LAB — VITAMIN B12: Vitamin B-12: 476 pg/mL (ref 211–911)

## 2024-01-12 LAB — IBC + FERRITIN
Ferritin: 442.7 ng/mL — ABNORMAL HIGH (ref 22.0–322.0)
Iron: 105 ug/dL (ref 42–165)
Saturation Ratios: 41.4 % (ref 20.0–50.0)
TIBC: 253.4 ug/dL (ref 250.0–450.0)
Transferrin: 181 mg/dL — ABNORMAL LOW (ref 212.0–360.0)

## 2024-01-12 LAB — TSH: TSH: 1.44 u[IU]/mL (ref 0.35–5.50)

## 2024-01-12 LAB — VITAMIN D 25 HYDROXY (VIT D DEFICIENCY, FRACTURES): VITD: 54.15 ng/mL (ref 30.00–100.00)

## 2024-01-12 MED ORDER — DOXYCYCLINE HYCLATE 100 MG PO TABS: 100.0000 mg | ORAL_TABLET | Freq: Two times a day (BID) | ORAL | 0 refills | Status: DC

## 2024-01-12 NOTE — Assessment & Plan Note (Addendum)
 On atorvastatin   80 mg daily LDL not at goal < 55  He has not lately been taking  statin... will restart daily. He will work on getting back on track with regular exercise and weight loss.  We discussed his diet in detail and ways to lower cholesterol.

## 2024-01-12 NOTE — Progress Notes (Unsigned)
 Patient ID: Lucas Willis, male    DOB: Dec 06, 1955, 68 y.o.   MRN: [id number]  This visit was conducted in person.  BP 110/70   Pulse 69   Temp 98.2 F (36.8 C) (Temporal)   Ht 5' 8 (1.727 m)   Wt 204 lb 4 oz (92.6 kg)   SpO2 95%   BMI 31.06 kg/m    CC:  Chief Complaint  Patient presents with   Annual Exam    Subjective:   HPI: Lucas Willis is a 68 y.o. male presenting on 01/12/2024 for Annual Exam  The patient presents for annual  complete physical and review of chronic health problems. He/She also has the following acute concerns today:   Increase in fatigue in last 3 months  Napping when getting home at night.  Only 6 hour. Because time not made for sleep.  No snoring.  No new SOB, CP  Has some swelling in feet at end of day at sock line... off and on.  No blood loss, always cold.  Works long hours  Recent  urgent care  8/11 visit for acute cough, chest tightness and wheeze.   Intermittent fever.  Treated with prednisone  taper, cough suppressant.   Now on day 6 of illness.Having more energy less wheeze, productive cough Flowsheet Row Office Visit from 01/12/2024 in [facility] Total Score 0       Elevated Cholesterol:  On atorvastatin   80 mg daily LDL not at goal < 70.  Has not been taking lately...will restart. Lab Results  Component Value Date   CHOL 159 01/05/2024   HDL 39.50 01/05/2024   LDLCALC 102 (H) 01/05/2024   LDLDIRECT 91.0 05/07/2022   TRIG 90.0 01/05/2024   CHOLHDL 4 01/05/2024  Using medications without problems: has cramps Muscle aches:  Diet compliance: minimal water, hear healthy Exercise:  was going 4 times a week, but has not been going since summer.. plans to restart. Other complaints:  Wt Readings from Last 3 Encounters:  01/12/24 204 lb 4 oz (92.6 kg)  02/09/23 197 lb (89.4 kg)  07/07/22 210 lb (95.3 kg)    CAD, cardiomyopathy and hx of HFrEF, now HFpEF: Followed by cardiology   Dr. Bernie Last OV reviewed from 01/2023:  repeat ECHO  EF showed preserved left ventricular ejection fraction and no significant valvular pathology   On Coreg , Entresto , spironolactone . 10/2021.SABRA EF50-55%! He is euvolemic today.  Relevant past medical, surgical, family and social history reviewed and updated as indicated. Interim medical history since our last visit reviewed. Allergies and medications reviewed and updated. Outpatient Medications Prior to Visit  Medication Sig Dispense Refill   amoxicillin -clavulanate (AUGMENTIN ) 875-125 MG tablet Take 1 tablet by mouth every 12 (twelve) hours. 14 tablet 0   Ascorbic Acid (VITAMIN C PO) Take 1 tablet by mouth daily.     aspirin  EC (ASPIRIN  LOW DOSE) 81 MG tablet TAKE 1 TABLET BY MOUTH EVERY DAY 90 tablet 0   atorvastatin  (LIPITOR) 80 MG tablet Take 1 tablet (80 mg total) by mouth daily. 90 tablet 1   b complex vitamins tablet Take 1 tablet by mouth daily. Unknown strength     BIOTIN PO Take 1 capsule by mouth daily. Unknown strength     carvedilol  (COREG ) 12.5 MG tablet TAKE 1 TABLET (12.5MG  TOTAL) BY MOUTH TWICE A DAY WITH MEALS 180 tablet 3   cetirizine (ZYRTEC) 10 MG tablet Take 10 mg by mouth daily.  Cholecalciferol 25 MCG (1000 UT) tablet Take 2,000 Units by mouth daily.     Chondroitin Sulfate-Vit C-Mn (CHONDROITIN SULFATE COMPLEX PO) Take 2 tablets by mouth daily. Unknown strength     Coenzyme Q10 (CO Q 10) 100 MG CAPS Take 10 mg by mouth daily.     diclofenac  (VOLTAREN ) 75 MG EC tablet Take 1 tablet (75 mg total) by mouth 2 (two) times daily. 50 tablet 2   ENTRESTO  49-51 MG TAKE 1 TABLET BY MOUTH TWICE A DAY 180 tablet 2   HYDROcodone  bit-homatropine (HYCODAN) 5-1.5 MG/5ML syrup Take 5 mLs by mouth every 6 (six) hours as needed for cough. 120 mL 0   MAGNESIUM PO Take 1 tablet by mouth daily. Unknown strength     meloxicam  (MOBIC ) 15 MG tablet Take 1 tablet (15 mg total) by mouth daily. 30 tablet 2   Multiple Vitamins-Minerals  (CENTRUM SILVER ULTRA MENS PO) Take 1 tablet by mouth daily. Unknown strength     nitroGLYCERIN  (NITROSTAT ) 0.4 MG SL tablet Place 1 tablet (0.4 mg total) under the tongue every 5 (five) minutes as needed for chest pain. 25 tablet 11   predniSONE  (DELTASONE ) 20 MG tablet Take 2 tablets (40 mg total) by mouth daily with breakfast for 5 days. 10 tablet 0   promethazine -dextromethorphan (PROMETHAZINE -DM) 6.25-15 MG/5ML syrup Take 5 mLs by mouth at bedtime as needed for cough. 118 mL 0   spironolactone  (ALDACTONE ) 25 MG tablet TAKE 1 TABLET (25 MG TOTAL) BY MOUTH DAILY. 90 tablet 0   terbinafine  (LAMISIL ) 250 MG tablet Take 1 tablet (250 mg total) by mouth daily. 90 tablet 0   Facility-Administered Medications Prior to Visit  Medication Dose Route Frequency Provider Last Rate Last Admin   triamcinolone  acetonide (KENALOG ) 10 MG/ML injection 10 mg  10 mg Intra-articular Once          Per HPI unless specifically indicated in ROS section below Review of Systems  Constitutional:  Negative for fatigue and fever.  HENT:  Negative for ear pain.   Eyes:  Negative for pain.  Respiratory:  Negative for cough and shortness of breath.   Cardiovascular:  Negative for chest pain, palpitations and leg swelling.  Gastrointestinal:  Negative for abdominal pain.  Genitourinary:  Negative for dysuria.  Musculoskeletal:  Negative for arthralgias.  Neurological:  Negative for syncope, light-headedness and headaches.  Psychiatric/Behavioral:  Negative for dysphoric mood.    Objective:  BP 110/70   Pulse 69   Temp 98.2 F (36.8 C) (Temporal)   Ht 5' 8 (1.727 m)   Wt 204 lb 4 oz (92.6 kg)   SpO2 95%   BMI 31.06 kg/m   Wt Readings from Last 3 Encounters:  01/12/24 204 lb 4 oz (92.6 kg)  02/09/23 197 lb (89.4 kg)  07/07/22 210 lb (95.3 kg)      Physical Exam Constitutional:      Appearance: He is well-developed.  HENT:     Head: Normocephalic.     Right Ear: Hearing normal.     Left Ear: Hearing  normal.     Nose: Nose normal.  Neck:     Thyroid : No thyroid  mass or thyromegaly.     Vascular: No carotid bruit.     Trachea: Trachea normal.  Cardiovascular:     Rate and Rhythm: Normal rate and regular rhythm.     Pulses: Normal pulses.     Heart sounds: Heart sounds not distant. No murmur heard.    No friction rub. No  gallop.     Comments: No peripheral edema Pulmonary:     Effort: Pulmonary effort is normal. No respiratory distress.     Breath sounds: Normal breath sounds.  Skin:    General: Skin is warm and dry.     Findings: No rash.  Psychiatric:        Speech: Speech normal.        Behavior: Behavior normal.        Thought Content: Thought content normal.       COVID 19 screen:  No recent travel or known exposure to COVID19 The patient denies respiratory symptoms of COVID 19 at this time. The importance of social distancing was discussed today.   Assessment and Plan The patient's preventative maintenance and recommended screening tests for an annual wellness exam were reviewed in full today. Brought up to date unless services declined.  Counselled on the importance of diet, exercise, and its role in overall health and mortality. The patient's FH and SH was reviewed, including their home life, tobacco status, and drug and alcohol status.   Vaccines: uptodate except due for shingrix Prostate Cancer Screen: stable Lab Results  Component Value Date   PSA 0.83 01/05/2024   PSA 1.00 05/14/2022   PSA 0.49 08/05/2020  Colon Cancer Screen: 12/05/2020  Dr. Aneita, sessile polyp, repeat in 7 years      Smoking Status: nonsmoker ETOH/ drug use: none/none  Hep C:   done  HIV screen:   refused.   Problem List Items Addressed This Visit     Cardiomyopathy (HCC)   Chronic, followed by cardiology      Heart failure with reduced ejection fraction (HCC)   Followed by Dr. Luberta cardiology.  Repeat echocardiogram in 2024 showed  preserved EF on Coreg , Entresto  and  spironolactone . He is euvolemic in office today      Pure hypercholesterolemia    On atorvastatin   80 mg daily LDL not at goal < 70. Discussed adding  zetia. He will work on getting back on track with regular exercise and weight loss.  We discussed his diet in detail and ways to lower cholesterol.      Other Visit Diagnoses       Routine general medical examination at a health care facility    -  Primary          Greig Ring, MD

## 2024-01-12 NOTE — Assessment & Plan Note (Signed)
Chronic, followed by cardiology

## 2024-01-12 NOTE — Assessment & Plan Note (Addendum)
 Followed by Dr. Krasowskii cardiology.  Repeat echocardiogram in 2024 showed  preserved EF on Coreg , Entresto  and spironolactone . He is euvolemic in office today

## 2024-01-12 NOTE — Patient Instructions (Signed)
 Complete antibiotics. Call for continuation of prednisone  if wheezing not continuing to improve. Go to ER if severe shortness of breath.

## 2024-01-16 ENCOUNTER — Telehealth: Payer: Self-pay

## 2024-01-16 NOTE — Telephone Encounter (Signed)
 See results note from labs/x-ray done on 01/12/24.

## 2024-01-16 NOTE — Telephone Encounter (Signed)
 Copied from CRM [id number]. Topic: Clinical - Lab/Test Results >> Jan 16, 2024 10:52 AM Henretta I wrote: Reason for CRM: Patient missed call from mrs. Arland regarding x-ray and lab results. Attempted to call [facility] but no answer. Patient would like a callback whenever.

## 2024-01-17 MED ORDER — PREDNISONE 20 MG PO TABS
ORAL_TABLET | ORAL | 0 refills | Status: DC
Start: 2024-01-17 — End: 2024-03-05

## 2024-02-19 ENCOUNTER — Other Ambulatory Visit: Payer: Self-pay | Admitting: Cardiology

## 2024-03-05 ENCOUNTER — Encounter: Payer: Self-pay | Admitting: Emergency Medicine

## 2024-03-05 ENCOUNTER — Ambulatory Visit
Admission: EM | Admit: 2024-03-05 | Discharge: 2024-03-05 | Disposition: A | Discharge status: Home / Self Care | Attending: Family Medicine | Admitting: Family Medicine

## 2024-03-05 DIAGNOSIS — J069 Acute upper respiratory infection, unspecified: Secondary | ICD-10-CM | POA: Diagnosis not present

## 2024-03-05 DIAGNOSIS — R051 Acute cough: Secondary | ICD-10-CM | POA: Diagnosis not present

## 2024-03-05 LAB — POC SOFIA SARS ANTIGEN FIA: SARS Coronavirus 2 Ag: NEGATIVE

## 2024-03-05 MED ORDER — AZITHROMYCIN 250 MG PO TABS: ORAL_TABLET | ORAL | 0 refills | Status: DC

## 2024-03-05 NOTE — ED Provider Notes (Signed)
 [facility]    CSN: [id number] Arrival date & time: 03/05/24  1614      History   Chief Complaint Chief Complaint  Patient presents with   Cough    HPI Lucas Willis is a 68 y.o. male.   HPI Patient states he just got back from a trip out of the country.  He states that since yesterday he has had a severe sore throat cough and runny nose.  He states that he is prone to bronchitis and had pneumonia as a child.  No underlying lung disease such as asthma or emphysema.  Non-smoker. Past Medical History:  Diagnosis Date   CAD, nonobstructive on 2017 cath 07/19/2017   Cardiomyopathy (HCC) 09/04/2015   Chronic systolic heart failure (HCC) 09/04/2015   COVID    Fatigue 07/19/2017   Heart failure with reduced ejection fraction (HCC) 09/04/2015   Followed by Dr. Bernie Cardiology. EF 43% on ECHO 07/19/2016  Repeat 2021:  EF40-46%   Leg cramps 05/14/2014   Osteoarthritis of left knee 04/05/2018   Pure hypercholesterolemia 06/04/2014    Patient Active Problem List   Diagnosis Date Noted   Dyslipidemia 07/07/2022   Dupuytren's contracture of right hand 05/14/2022   Seborrheic keratosis 05/14/2022   Lump of right breast 09/26/2020   S/P left knee arthroscopy 05/20/2020   Osteoarthritis of left knee 04/05/2018   CAD, nonobstructive on 2017 cath 07/19/2017   Cardiomyopathy (HCC) 09/04/2015   Heart failure with reduced ejection fraction (HCC) 09/04/2015   Pure hypercholesterolemia 06/04/2014   Leg cramps 05/14/2014    Past Surgical History:  Procedure Laterality Date   CARDIAC CATHETERIZATION N/A 09/11/2015   Procedure: Right/Left Heart Cath and Coronary Angiography;  Surgeon: Peter M Swaziland, MD;  Location: [facility] INVASIVE CV LAB;  Service: Cardiovascular;  Laterality: N/A;   COLONOSCOPY  2011   MS-F/V-movi(good)-TA-recall 53yrs   CYST REMOVAL LEG Left 1970   back of L knee   HERNIA REPAIR Left 1979   inguinal   KNEE ARTHROSCOPY Left 2021   PLANTAR'S WART  EXCISION  1972   POLYPECTOMY  2011   TA   SHOULDER SURGERY Right 2008   Dislocation Repair   TONSILLECTOMY     WISDOM TOOTH EXTRACTION         Home Medications    Prior to Admission medications   Medication Sig Start Date End Date Taking? Authorizing Provider  Ascorbic Acid (VITAMIN C PO) Take 1 tablet by mouth daily.   Yes [provider]  aspirin  EC (ASPIRIN  LOW DOSE) 81 MG tablet TAKE 1 TABLET BY MOUTH EVERY DAY 02/21/24  Yes Krasowski, Robert J, MD  atorvastatin  (LIPITOR) 80 MG tablet TAKE 1 TABLET BY MOUTH EVERY DAY 02/21/24  Yes Krasowski, Robert J, MD  azithromycin (ZITHROMAX Z-PAK) 250 MG tablet Take two pills today followed by one a day until gone 03/05/24  Yes Maranda Jamee Jacob, MD  b complex vitamins tablet Take 1 tablet by mouth daily. Unknown strength   Yes [provider]  BIOTIN PO Take 1 capsule by mouth daily. Unknown strength   Yes [provider]  carvedilol  (COREG ) 12.5 MG tablet TAKE 1 TABLET (12.5MG  TOTAL) BY MOUTH TWICE A DAY WITH MEALS 09/12/23  Yes Krasowski, Robert J, MD  cetirizine (ZYRTEC) 10 MG tablet Take 10 mg by mouth daily.   Yes [provider]  Cholecalciferol 25 MCG (1000 UT) tablet Take 2,000 Units by mouth daily.   Yes [provider]  Chondroitin Sulfate-Vit C-Mn (  CHONDROITIN SULFATE COMPLEX PO) Take 2 tablets by mouth daily. Unknown strength   Yes [provider]  Coenzyme Q10 (CO Q 10) 100 MG CAPS Take 10 mg by mouth daily.   Yes [provider]  diclofenac  (VOLTAREN ) 75 MG EC tablet Take 1 tablet (75 mg total) by mouth 2 (two) times daily. 11/30/23  Yes Regal, Pasco RAMAN, DPM  MAGNESIUM PO Take 1 tablet by mouth daily. Unknown strength   Yes [provider]  Multiple Vitamins-Minerals (CENTRUM SILVER ULTRA MENS PO) Take 1 tablet by mouth daily. Unknown strength   Yes [provider]  nitroGLYCERIN  (NITROSTAT ) 0.4 MG SL tablet Place 1 tablet (0.4 mg total) under the tongue  every 5 (five) minutes as needed for chest pain. 07/07/22  Yes Krasowski, Robert J, MD  sacubitril -valsartan  (ENTRESTO ) 49-51 MG TAKE 1 TABLET BY MOUTH TWICE A DAY 02/21/24  Yes Krasowski, Robert J, MD  spironolactone  (ALDACTONE ) 25 MG tablet TAKE 1 TABLET (25 MG TOTAL) BY MOUTH DAILY. 02/21/24  Yes Bernie Lamar PARAS, MD    Family History Family History  Problem Relation Age of Onset   Heart failure Mother    Colon polyps Neg Hx    Colon cancer Neg Hx    Esophageal cancer Neg Hx    Stomach cancer Neg Hx    Rectal cancer Neg Hx     Social History Social History   Tobacco Use   Smoking status: Never   Smokeless tobacco: Never  Vaping Use   Vaping status: Never Used  Substance Use Topics   Alcohol use: Yes    Comment: rarely   Drug use: No     Allergies   Demerol [meperidine] and Sulfonamide derivatives   Review of Systems Review of Systems  See HPI Physical Exam Triage Vital Signs ED Triage Vitals  Encounter Vitals Group     BP 03/05/24 1625 123/75     Girls Systolic BP Percentile --      Girls Diastolic BP Percentile --      Boys Systolic BP Percentile --      Boys Diastolic BP Percentile --      Pulse Rate 03/05/24 1625 97     Resp 03/05/24 1625 18     Temp 03/05/24 1625 (!) 100.7 F (38.2 C)     Temp Source 03/05/24 1625 Oral     SpO2 03/05/24 1625 94 %     Weight 03/05/24 1624 203 lb (92.1 kg)     Height 03/05/24 1624 5' 10 (1.778 m)     Head Circumference --      Peak Flow --      Pain Score 03/05/24 1624 0     Pain Loc --      Pain Education --      Exclude from Growth Chart --    No data found.  Updated Vital Signs BP 123/75 (BP Location: Right Arm)   Pulse 97   Temp (!) 100.7 F (38.2 C) (Oral)   Resp 18   Ht 5' 10 (1.778 m)   Wt 92.1 kg   SpO2 94%   BMI 29.13 kg/m       Physical Exam Constitutional:      General: He is not in acute distress.    Appearance: He is well-developed.  HENT:     Head: Normocephalic and atraumatic.      Right Ear: Tympanic membrane normal.     Left Ear: Tympanic membrane normal.     Nose:  Nose normal.     Mouth/Throat:     Pharynx: Posterior oropharyngeal erythema present.  Eyes:     Conjunctiva/sclera: Conjunctivae normal.     Pupils: Pupils are equal, round, and reactive to light.  Cardiovascular:     Rate and Rhythm: Normal rate and regular rhythm.     Heart sounds: Normal heart sounds.  Pulmonary:     Effort: Pulmonary effort is normal. No respiratory distress.     Breath sounds: Normal breath sounds.  Abdominal:     General: There is no distension.     Palpations: Abdomen is soft.  Musculoskeletal:        General: Normal range of motion.     Cervical back: Normal range of motion.  Lymphadenopathy:     Cervical: No cervical adenopathy.  Skin:    General: Skin is warm and dry.  Neurological:     Mental Status: He is alert.      UC Treatments / Results  Labs (all labs ordered are listed, but only abnormal results are displayed) Labs Reviewed  POC SOFIA SARS ANTIGEN FIA - Normal    EKG   Radiology No results found.  Procedures Procedures (including critical care time)  Medications Ordered in UC Medications - No data to display  Initial Impression / Assessment and Plan / UC Course  I have reviewed the triage vital signs and the nursing notes.  Pertinent labs & imaging results that were available during my care of the patient were reviewed by me and considered in my medical decision making (see chart for details).     Discussed viral illness.  COVID test is negative.  Antibiotics not indicated unless symptoms persist longer than 10 days.  Patient feels that he needs an antibiotic for his respiratory infection.  He is given a written prescription but advised not to fill or take it unless his symptoms persist longer than a week Final Clinical Impressions(s) / UC Diagnoses   Final diagnoses:  Acute cough  Viral URI with cough     Discharge Instructions       May continue over-the-counter cough and cold medicines Drink lots of fluids I have given you a written prescription for azithromycin to fill and take if you fail to improve     ED Prescriptions     Medication Sig Dispense Auth. Provider   azithromycin (ZITHROMAX Z-PAK) 250 MG tablet Take two pills today followed by one a day until gone 6 tablet Maranda Jamee Jacob, MD      PDMP not reviewed this encounter.   Maranda Jamee Jacob, MD 03/05/24 [phone]

## 2024-03-05 NOTE — Discharge Instructions (Signed)
 May continue over-the-counter cough and cold medicines Drink lots of fluids I have given you a written prescription for azithromycin to fill and take if you fail to improve

## 2024-03-05 NOTE — ED Triage Notes (Signed)
 Patient c/o cough, sore throat, nasal drainage x 2 days.  Patient just returned from an out of the country trip.  Patient has taken Dayquil.

## 2024-03-07 ENCOUNTER — Encounter: Payer: Self-pay | Admitting: Cardiology

## 2024-03-07 DIAGNOSIS — I471 Supraventricular tachycardia, unspecified: Secondary | ICD-10-CM | POA: Insufficient documentation

## 2024-03-07 DIAGNOSIS — I739 Peripheral vascular disease, unspecified: Secondary | ICD-10-CM | POA: Insufficient documentation

## 2024-03-07 NOTE — Progress Notes (Deleted)
 Cardiology Office Note   Date:  03/07/2024  ID:  Lucas Willis, DOB 09/09/1955, MRN [id number] PCP: Avelina Greig BRAVO, MD  [facility] Providers Cardiologist:  None { Click to update primary MD,subspecialty MD or APP then REFRESH:1}    History of Present Illness Lucas WATLINGTON is a 68 y.o. male with a past medical history of /nonobstructive CAD, HFrEF, dyslipidemia, PAD, NSVT, SVT  03/15/2023 echo EF 50 to 55%, mild TR 11/17/2021 lower extremity arterial segmentals right noncompressible RLEA, left noncompressible LEA 11/04/2021 echo EF 50 to 55%, grade 1 DD 10/01/2020 monitor average heart rate 80 bpm, predominant rhythm is sinus, 8 runs of NSVT, 52 episodes of SVT 11/12/2020 echo EF 45 to 50%, LV with hypokinesis 09/11/2015 right left heart cath nonobstructive CAD  In 2017 he had been in excellent health although developed upper respiratory infection requiring 2 courses of Levaquin, he then had a tendon rupture, began noticing shortness of breath as well as weight gain.  He was ultimately evaluated by Dr. Levern, diagnosed with HFrEF with an EF around 25% was started on GDMT.  He underwent a left heart cath at that time revealing mild nonobstructive CAD.  He established care with Dr. Krasowski in 2020 for evaluation and management of HFrEF and mild nonobstructive CAD.  He wore a monitor in 2022 revealing 8 runs of NSVT and 52 episodes of SVT >> referred to EP recommended to continue with his current medications and no antiarrhythmics were added and he was advised to follow up PRN with EP. Most recently evaluated by Dr. Bernie on 02/09/2023, was bothered by a persistent cough, but otherwise well from a cardiac perspective repeat echocardiogram was arranged revealing an EF of 50 to 55%, and he was advised to follow-up in 1 year.    ROS: ROS   Studies Reviewed      Cardiac Studies & Procedures    ______________________________________________________________________________________________ CARDIAC CATHETERIZATION  CARDIAC CATHETERIZATION 09/11/2015  Conclusion  Ost RCA to Prox RCA lesion, 20% stenosed.  Dist RCA lesion, 30% stenosed.  Prox LAD lesion, 50% stenosed.  Mid LAD lesion, 30% stenosed.  Ost Cx to Prox Cx lesion, 25% stenosed.  Ost 2nd Mrg to 2nd Mrg lesion, 10% stenosed.  There is severe left ventricular systolic dysfunction.  1. Nonobstructive CAD 2. Severe LV dysfunction 3. Normal right heart and LV filling pressures.  Findings Coronary Findings Diagnostic  Dominance: Right  Left Main Vessel was injected. Vessel is normal in caliber. Vessel is angiographically normal.  Left Anterior Descending Moderately Calcified.  Left Circumflex  First Obtuse Marginal Branch The vessel is small in size.  Second Obtuse Marginal Branch The vessel is large in size. Diffuse.  Right Coronary Artery Moderately Calcified.  Intervention  No interventions have been documented.     ECHOCARDIOGRAM  ECHOCARDIOGRAM COMPLETE 03/10/2023  Narrative ECHOCARDIOGRAM REPORT    Patient Name:   [facility] Date of Exam: 03/10/2023 Medical Rec #:  [id number]       Height:       69.5 in Accession #:    [id number]      Weight:       197.0 lb Date of Birth:  09-Oct-1955       BSA:          2.063 m Patient Age:    66 years        BP:           96/64 mmHg Patient Gender: M  HR:           71 bpm. Exam Location:  [place]  Procedure: 2D Echo, 3D Echo, Cardiac Doppler and Color Doppler  Indications:    Dilated cardiomyopathy (HCC) [I42.0 (ICD-10-CM)  History:        Patient has prior history of Echocardiogram examinations, most recent 11/04/2021. Cardiomyopathy and CHF, CAD and Previous Myocardial Infarction, Arrythmias:Tachycardia; Risk Factors:Hypertension, Dyslipidemia and Non-Smoker.  Sonographer:    Alan Greenhouse RDMS, RVT, RDCS Referring  Phys: [phone] LAMAR PARAS KRASOWSKI  IMPRESSIONS   1. Left ventricular ejection fraction, by estimation, is 55 to 60%. The left ventricle has normal function. The left ventricle has no regional wall motion abnormalities. Left ventricular diastolic parameters were normal. 2. Right ventricular systolic function is normal. The right ventricular size is normal. 3. The mitral valve is normal in structure. Trivial mitral valve regurgitation. No evidence of mitral stenosis. 4. The aortic valve is normal in structure. Aortic valve regurgitation is not visualized. No aortic stenosis is present. 5. The inferior vena cava is normal in size with greater than 50% respiratory variability, suggesting right atrial pressure of 3 mmHg.  Comparison(s): Echocardiogram done 11/04/21 showed an EF of 50-55%.  FINDINGS Left Ventricle: Left ventricular ejection fraction, by estimation, is 55 to 60%. The left ventricle has normal function. The left ventricle has no regional wall motion abnormalities. The left ventricular internal cavity size was normal in size. There is no left ventricular hypertrophy. Left ventricular diastolic parameters were normal.  Right Ventricle: The right ventricular size is normal. No increase in right ventricular wall thickness. Right ventricular systolic function is normal.  Left Atrium: Left atrial size was normal in size.  Right Atrium: Right atrial size was normal in size.  Pericardium: There is no evidence of pericardial effusion.  Mitral Valve: The mitral valve is normal in structure. Trivial mitral valve regurgitation. No evidence of mitral valve stenosis.  Tricuspid Valve: The tricuspid valve is normal in structure. Tricuspid valve regurgitation is mild . No evidence of tricuspid stenosis.  Aortic Valve: The aortic valve is normal in structure. Aortic valve regurgitation is not visualized. No aortic stenosis is present. Aortic valve mean gradient measures 2.0 mmHg. Aortic valve peak  gradient measures 4.1 mmHg. Aortic valve area, by VTI measures 2.62 cm.  Pulmonic Valve: The pulmonic valve was normal in structure. Pulmonic valve regurgitation is not visualized. No evidence of pulmonic stenosis.  Aorta: The aortic root is normal in size and structure.  Venous: The inferior vena cava is normal in size with greater than 50% respiratory variability, suggesting right atrial pressure of 3 mmHg.  IAS/Shunts: No atrial level shunt detected by color flow Doppler.   LEFT VENTRICLE PLAX 2D LVIDd:         5.00 cm     Diastology LVIDs:         3.40 cm     LV e' medial:    7.40 cm/s LV PW:         1.20 cm     LV E/e' medial:  11.6 LV IVS:        0.90 cm     LV e' lateral:   10.40 cm/s LVOT diam:     1.90 cm     LV E/e' lateral: 8.2 LV SV:         61 LV SV Index:   30 LVOT Area:     2.84 cm  3D Volume EF: LV Volumes (MOD)  3D EF:        56 % LV vol d, MOD A2C: 68.6 ml LV EDV:       176 ml LV vol d, MOD A4C: 76.2 ml LV ESV:       77 ml LV vol s, MOD A2C: 26.4 ml LV SV:        98 ml LV vol s, MOD A4C: 32.2 ml LV SV MOD A2C:     42.2 ml LV SV MOD A4C:     76.2 ml LV SV MOD BP:      43.7 ml  RIGHT VENTRICLE RV S prime:     8.70 cm/s TAPSE (M-mode): 1.9 cm  LEFT ATRIUM             Index        RIGHT ATRIUM           Index LA diam:        3.30 cm 1.60 cm/m   RA Area:     13.00 cm LA Vol (A2C):   50.8 ml 24.62 ml/m  RA Volume:   30.90 ml  14.98 ml/m LA Vol (A4C):   40.6 ml 19.68 ml/m LA Biplane Vol: 48.1 ml 23.32 ml/m AORTIC VALVE AV Area (Vmax):    2.46 cm AV Area (Vmean):   2.35 cm AV Area (VTI):     2.62 cm AV Vmax:           101.00 cm/s AV Vmean:          76.300 cm/s AV VTI:            0.234 m AV Peak Grad:      4.1 mmHg AV Mean Grad:      2.0 mmHg LVOT Vmax:         87.50 cm/s LVOT Vmean:        63.200 cm/s LVOT VTI:          0.216 m LVOT/AV VTI ratio: 0.92  AORTA Ao Root diam: 3.35 cm Ao Asc diam:  3.10 cm  MITRAL VALVE                TRICUSPID VALVE MV Area (PHT): 4.41 cm    TR Peak grad:   12.1 mmHg MV Decel Time: 172 msec    TR Vmax:        174.00 cm/s MR Peak grad: 53.6 mmHg MR Vmax:      366.00 cm/s  SHUNTS MV E velocity: 85.70 cm/s  Systemic VTI:  0.22 m MV A velocity: 69.80 cm/s  Systemic Diam: 1.90 cm MV E/A ratio:  1.23  Lamar Fitch MD Electronically signed by Lamar Fitch MD Signature Date/Time: 03/15/2023/11:10:10 AM    Final    MONITORS  LONG TERM MONITOR (3-14 DAYS) 10/17/2020  Narrative Patch Wear Time:  7 days and 13 hours (2022-05-05T19:12:49-0400 to 2022-05-13T08:33:57-0400)  Patient had a min HR of 37 bpm, max HR of 235 bpm, and avg HR of 80 bpm. Predominant underlying rhythm was Sinus Rhythm. First Degree AV Block was present. 8 Ventricular Tachycardia runs occurred, the run with the fastest interval lasting 10 beats with a max rate of 235 bpm, the longest lasting 10 beats with an avg rate of 163 bpm. 52 Supraventricular Tachycardia runs occurred, the run with the fastest interval lasting 5 beats with a max rate of 167 bpm, the longest lasting 27 beats with an avg rate of 109 bpm. Some episodes of Supraventricular Tachycardia may be possible Atrial Tachycardia with  variable block. Second Degree AV Block-Mobitz I (Wenckebach) was present. Isolated SVEs were occasional (2.6%, 21029), SVE Couplets were rare (<1.0%, 1446), and SVE Triplets were rare (<1.0%, 876). Isolated VEs were occasional (2.2%, 18149), VE Couplets were rare (<1.0%, 2508), and VE Triplets were rare (<1.0%, 359). Ventricular Bigeminy and Trigeminy were present.  Summary conclusions: Multiple episode of nonsustained ventricular tachycardia identified. Longest episode 10 beats at rate of 163, fastest episode at rate of 235 bpm. 52 episode of supraventricular tachycardia noted. Triggered events for PVCs       ______________________________________________________________________________________________      Risk  Assessment/Calculations {Does this patient have ATRIAL FIBRILLATION?:[id number]} No BP recorded.  {Refresh Note OR Click here to enter BP  :1}***       Physical Exam VS:  There were no vitals taken for this visit.       Wt Readings from Last 3 Encounters:  03/05/24 203 lb (92.1 kg)  01/12/24 204 lb 4 oz (92.6 kg)  02/09/23 197 lb (89.4 kg)    GEN: Well nourished, well developed in no acute distress NECK: No JVD; No carotid bruits CARDIAC: ***RRR, no murmurs, rubs, gallops RESPIRATORY:  Clear to auscultation without rales, wheezing or rhonchi  ABDOMEN: Soft, non-tender, non-distended EXTREMITIES:  No edema; No deformity   ASSESSMENT AND PLAN HFimpEF -  CAD -      {Are you ordering a CV Procedure (e.g. stress test, cath, DCCV, TEE, etc)?   Press F2        :[id number]}  Dispo: ***  Signed, Delon JAYSON Hoover, NP

## 2024-03-09 ENCOUNTER — Ambulatory Visit: Admitting: Cardiology

## 2024-03-09 ENCOUNTER — Telehealth: Payer: Self-pay | Admitting: Cardiology

## 2024-03-09 DIAGNOSIS — I2510 Atherosclerotic heart disease of native coronary artery without angina pectoris: Secondary | ICD-10-CM

## 2024-03-09 DIAGNOSIS — I5020 Unspecified systolic (congestive) heart failure: Secondary | ICD-10-CM

## 2024-03-09 DIAGNOSIS — E785 Hyperlipidemia, unspecified: Secondary | ICD-10-CM

## 2024-03-09 DIAGNOSIS — I4729 Other ventricular tachycardia: Secondary | ICD-10-CM

## 2024-03-09 MED ORDER — SACUBITRIL-VALSARTAN 49-51 MG PO TABS: 1.0000 | ORAL_TABLET | Freq: Two times a day (BID) | ORAL | 0 refills | Status: DC

## 2024-03-09 NOTE — Telephone Encounter (Signed)
*  STAT* If patient is at the pharmacy, call can be transferred to refill team.   1. Which medications need to be refilled? (please list name of each medication and dose if known) sacubitril -valsartan  (ENTRESTO ) 49-51 MG    2. Would you like to learn more about the convenience, safety, & potential cost savings by using the [facility]? No   3. Are you open to using the [facility] (Type [facility].) No   4. Which pharmacy/location (including street and city if local pharmacy) is medication to be sent to? [id number] [place], [place] - [address]    5. Do they need a 30 day or 90 day supply? 90 day  Pt has scheduled appt on 06/05/24

## 2024-03-09 NOTE — Telephone Encounter (Signed)
 RX sent in

## 2024-03-22 ENCOUNTER — Other Ambulatory Visit: Payer: Self-pay | Admitting: Cardiology

## 2024-04-25 ENCOUNTER — Telehealth (HOSPITAL_BASED_OUTPATIENT_CLINIC_OR_DEPARTMENT_OTHER): Payer: Self-pay | Admitting: *Deleted

## 2024-04-25 DIAGNOSIS — M25572 Pain in left ankle and joints of left foot: Secondary | ICD-10-CM | POA: Diagnosis not present

## 2024-04-25 DIAGNOSIS — G8929 Other chronic pain: Secondary | ICD-10-CM | POA: Diagnosis not present

## 2024-04-25 DIAGNOSIS — R2242 Localized swelling, mass and lump, left lower limb: Secondary | ICD-10-CM | POA: Diagnosis not present

## 2024-04-25 DIAGNOSIS — R2240 Localized swelling, mass and lump, unspecified lower limb: Secondary | ICD-10-CM | POA: Insufficient documentation

## 2024-04-25 NOTE — Telephone Encounter (Signed)
 D/w the preop APP pt has appt 06/05/24 with DR. Bernie. However, based on the procedure to be done would they suggest to try to get the pt in sooner for appt for preop clearance needed. Per preop APP answered yes please sooner appt.   I will send a message to scheduling team for [facility] to see if they will reach put to the pt with a sooner appt.

## 2024-04-25 NOTE — Telephone Encounter (Signed)
   Pre-operative Risk Assessment    Patient Name: Lucas Willis  DOB: [birth year] MRN: [id number]   Date of last office visit: 02/09/23 DR. KRASOWSKI Date of next office visit: 06/05/24 DR. KRASOWSKI/APPT NOTES: EKG f/u chart prep TG Labs EPIC    Request for Surgical Clearance    Procedure:  LEFT ANKLE EXCISIONAL Bx   Date of Surgery:  Clearance TBD                                Surgeon:  DR. NORLEEN HEWITT Surgeon's Group or Practice Name:  [facility] Phone number:  [phone] MEGAN DAVIS Fax number:  [phone]   Type of Clearance Requested:   - Medical  - Pharmacy:  Hold Aspirin      Type of Anesthesia:  MAC WITH BLOCK   Additional requests/questions:    Lucas Willis   04/25/2024, 10:49 AM

## 2024-04-25 NOTE — Telephone Encounter (Signed)
   Name: Lucas Willis  DOB: 04-13-1956  MRN: [id number]  Primary Cardiologist: None  Chart reviewed as part of pre-operative protocol coverage. Because of Makhari Dovidio [place]'s past medical history and time since last visit, he will require a follow-up in-office visit in order to better assess preoperative cardiovascular risk.  Pre-op covering staff: - Please schedule appointment and call patient to inform them. If patient already had an upcoming appointment within acceptable timeframe, please add pre-op clearance to the appointment notes so provider is aware. - Please contact requesting surgeon's office via preferred method (i.e, phone, fax) to inform them of need for appointment prior to surgery.    Has an appt but not until January, no date on surgery. Please add to appt notes.   Jon Garre [place] Deleo, PA  04/25/2024, 11:06 AM

## 2024-04-30 ENCOUNTER — Encounter: Payer: Self-pay | Admitting: *Deleted

## 2024-04-30 ENCOUNTER — Encounter: Payer: Self-pay | Admitting: Cardiology

## 2024-04-30 ENCOUNTER — Ambulatory Visit: Attending: Cardiology | Admitting: Cardiology

## 2024-04-30 VITALS — BP 100/64 | HR 69 | Ht 70.0 in | Wt 207.0 lb

## 2024-04-30 DIAGNOSIS — I2510 Atherosclerotic heart disease of native coronary artery without angina pectoris: Secondary | ICD-10-CM

## 2024-04-30 DIAGNOSIS — Z01810 Encounter for preprocedural cardiovascular examination: Secondary | ICD-10-CM | POA: Diagnosis not present

## 2024-04-30 DIAGNOSIS — I5020 Unspecified systolic (congestive) heart failure: Secondary | ICD-10-CM

## 2024-04-30 DIAGNOSIS — I420 Dilated cardiomyopathy: Secondary | ICD-10-CM | POA: Diagnosis not present

## 2024-04-30 DIAGNOSIS — I471 Supraventricular tachycardia, unspecified: Secondary | ICD-10-CM

## 2024-04-30 DIAGNOSIS — R0609 Other forms of dyspnea: Secondary | ICD-10-CM

## 2024-04-30 NOTE — Patient Instructions (Signed)
 Medication Instructions:  Your physician recommends that you continue on your current medications as directed. Please refer to the Current Medication list given to you today.  *If you need a refill on your cardiac medications before your next appointment, please call your pharmacy*   Lab Work: None Ordered If you have labs (blood work) drawn today and your tests are completely normal, you will receive your results only by: MyChart Message (if you have MyChart) OR A paper copy in the mail If you have any lab test that is abnormal or we need to change your treatment, we will call you to review the results.   Testing/Procedures: Your physician has requested that you have an echocardiogram. Echocardiography is a painless test that uses sound waves to create images of your heart. It provides your doctor with information about the size and shape of your heart and how well your heart's chambers and valves are working. This procedure takes approximately one hour. There are no restrictions for this procedure. Please do NOT wear cologne, perfume, aftershave, or lotions (deodorant is allowed). Please arrive 15 minutes prior to your appointment time.  Please note: We ask at that you not bring children with you during ultrasound (echo/ vascular) testing. Due to room size and safety concerns, children are not allowed in the ultrasound rooms during exams. Our front office staff cannot provide observation of children in our lobby area while testing is being conducted. An adult accompanying a patient to their appointment will only be allowed in the ultrasound room at the discretion of the ultrasound technician under special circumstances. We apologize for any inconvenience.    Follow-Up: At [facility], you and your health needs are our priority.  As part of our continuing mission to provide you with exceptional heart care, we have created designated Provider Care Teams.  These Care Teams include your  primary Cardiologist (physician) and Advanced Practice Providers (APPs -  Physician Assistants and Nurse Practitioners) who all work together to provide you with the care you need, when you need it.  We recommend signing up for the patient portal called MyChart.  Sign up information is provided on this After Visit Summary.  MyChart is used to connect with patients for Virtual Visits (Telemedicine).  Patients are able to view lab/test results, encounter notes, upcoming appointments, etc.  Non-urgent messages can be sent to your provider as well.   To learn more about what you can do with MyChart, go to [email or web address].    Your next appointment:   9 month(s)  The format for your next appointment:   In Person  Provider:   Lamar Fitch, MD    Other Instructions NA

## 2024-04-30 NOTE — Progress Notes (Unsigned)
 Cardiology Office Note:    Date:  04/30/2024   ID:  [name], DOB [birth year], MRN [id number]  PCP:  Avelina Greig BRAVO, MD  Cardiologist:  Lamar Fitch, MD    Referring MD: Avelina Greig BRAVO, MD   Chief Complaint  Patient presents with   Pre-op Exam    History of Present Illness:    Lucas Willis is a 68 y.o. male past medical history significant for nonischemic cardiomyopathy ejection fraction 45% now with normalization, cardiac catheterization done in 2017 nonobstructive disease, additional problem include dyslipidemia, he comes today to my office he is scheduled to have some surgery of the ankle done look like he is going to be local anesthesia still doing very well still able to do a lot of exercises with no difficulty still riding dirt bikes.  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 Fitch Cardiology. EF 43% on ECHO 07/19/2016  Repeat 2021:  EF40-46%   Leg cramps 05/14/2014   Lump of right breast 09/26/2020   Osteoarthritis of left knee 04/05/2018   PAD (peripheral artery disease)    Pure hypercholesterolemia 06/04/2014   S/P left knee arthroscopy 05/20/2020   Seborrheic keratosis 05/14/2022   SVT (supraventricular tachycardia)     Past Surgical History:  Procedure Laterality Date   CARDIAC CATHETERIZATION N/A 09/11/2015   Procedure: Right/Left Heart Cath and Coronary Angiography;  Surgeon: Peter M Jordan, MD;  Location: [facility] INVASIVE CV LAB;  Service: Cardiovascular;  Laterality: N/A;   COLONOSCOPY  2011   MS-F/V-movi(good)-TA-recall 75yrs   CYST REMOVAL LEG Left 1970    back of L knee   HERNIA REPAIR Left 1979   inguinal   KNEE ARTHROSCOPY Left 2021   PLANTAR'S WART EXCISION  1972   POLYPECTOMY  2011   TA   SHOULDER SURGERY Right 2008   Dislocation Repair   TONSILLECTOMY     WISDOM TOOTH EXTRACTION      Current Medications: Current Meds  Medication Sig   Ascorbic Acid (VITAMIN C PO) Take 1 tablet by mouth daily.   aspirin  EC (ASPIRIN  LOW DOSE) 81 MG tablet TAKE 1 TABLET BY MOUTH EVERY DAY   atorvastatin  (LIPITOR) 80 MG tablet TAKE 1 TABLET BY MOUTH EVERY DAY   b complex vitamins tablet Take 1 tablet by mouth daily. Unknown strength   BIOTIN PO Take 1 capsule by mouth daily. Unknown strength   carvedilol  (COREG ) 12.5 MG tablet TAKE 1 TABLET (12.5MG  TOTAL) BY MOUTH TWICE A DAY WITH MEALS   cetirizine (ZYRTEC) 10 MG tablet Take 10 mg by mouth daily.   Cholecalciferol 25 MCG (1000 UT) tablet Take 2,000 Units by mouth daily.   Chondroitin Sulfate-Vit C-Mn (CHONDROITIN SULFATE COMPLEX PO) Take 2 tablets by mouth daily. Unknown strength   Coenzyme Q10 (CO Q 10) 100 MG CAPS Take 10 mg by mouth daily.   diclofenac  (VOLTAREN ) 75 MG EC tablet Take 1 tablet (75 mg total) by mouth 2 (two) times daily.   MAGNESIUM PO Take 1 tablet by mouth daily. Unknown strength   Multiple Vitamins-Minerals (CENTRUM SILVER ULTRA MENS PO) Take 1 tablet by mouth daily. Unknown strength  nitroGLYCERIN  (NITROSTAT ) 0.4 MG SL tablet Place 1 tablet (0.4 mg total) under the tongue every 5 (five) minutes as needed for chest pain.   sacubitril -valsartan  (ENTRESTO ) 49-51 MG Take 1 tablet by mouth 2 (two) times daily.   spironolactone  (ALDACTONE ) 25 MG tablet TAKE 1 TABLET (25 MG TOTAL) BY MOUTH DAILY.   tretinoin (RETIN-A) 0.025 % cream Apply 1 Application topically at bedtime.     Allergies:   Demerol [meperidine] and Sulfonamide derivatives   Social History   Socioeconomic History   Marital status: Married    Spouse name: Cheryl   Number of children: 2   Years of education:  Not on file   Highest education level: Not on file  Occupational History   Occupation: print production planner  Tobacco Use   Smoking status: Never   Smokeless tobacco: Never  Vaping Use   Vaping status: Never Used  Substance and Sexual Activity   Alcohol use: Yes    Comment: rarely   Drug use: No   Sexual activity: Yes  Other Topics Concern   Not on file  Social History Narrative   2 kids, 2 and 19 year old.    Married   Social Drivers of Corporate Investment Banker Strain: Not on file  Food Insecurity: Not on file  Transportation Needs: Not on file  Physical Activity: Not on file  Stress: Not on file  Social Connections: Not on file     Family History: The patient's family history includes Heart failure in his mother. There is no history of Colon polyps, Colon cancer, Esophageal cancer, Stomach cancer, or Rectal cancer. ROS:   Please see the history of present illness.    All 14 point review of systems negative except as described per history of present illness  EKGs/Labs/Other Studies Reviewed:         Recent Labs: 01/05/2024: ALT 21; BUN 15; Creatinine, Ser 0.71; Potassium 4.5; Sodium 144 01/12/2024: Hemoglobin 13.4; Platelets 217.0; TSH 1.44  Recent Lipid Panel    Component Value Date/Time   CHOL 159 01/05/2024 0728   CHOL 131 02/09/2023 0953   TRIG 90.0 01/05/2024 0728   TRIG 258 (HH) 06/08/2006 1002   HDL 39.50 01/05/2024 0728   HDL 45 02/09/2023 0953   CHOLHDL 4 01/05/2024 0728   VLDL 18.0 01/05/2024 0728   LDLCALC 102 (H) 01/05/2024 0728   LDLCALC 68 02/09/2023 0953   LDLCALC 89 04/11/2018 0734   LDLDIRECT 91.0 05/07/2022 0809    Physical Exam:    VS:  BP 100/64   Pulse 69   Ht 5' 10 (1.778 m)   Wt 207 lb (93.9 kg)   SpO2 98%   BMI 29.70 kg/m     Wt Readings from Last 3 Encounters:  04/30/24 207 lb (93.9 kg)  03/05/24 203 lb (92.1 kg)  01/12/24 204 lb 4 oz (92.6 kg)     GEN:  Well nourished, well developed in no acute  distress HEENT: Normal NECK: No JVD; No carotid bruits LYMPHATICS: No lymphadenopathy CARDIAC: RRR, no murmurs, no rubs, no gallops RESPIRATORY:  Clear to auscultation without rales, wheezing or rhonchi  ABDOMEN: Soft, non-tender, non-distended MUSCULOSKELETAL:  No edema; No deformity  SKIN: Warm and dry LOWER EXTREMITIES: no swelling NEUROLOGIC:  Alert and oriented x 3 PSYCHIATRIC:  Normal affect   ASSESSMENT:    1. Dilated cardiomyopathy (HCC)   2. Preop cardiovascular exam   3. 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 Heart failure with reduced ejection  fraction (HCC)   5. SVT (supraventricular tachycardia)    PLAN:    In order of problems listed above:  History of dilated cardiomyopathy on guideline directed medical therapy, will schedule him to have another echocardiogram but overall hemodynamically compensated. Cardiovascular preop evaluation from cardiac standpoint review should be good to proceed with surgery as scheduled at relatively acceptable risk. CAD nonobstructive based on cardiac cath from 2017, does exercise aggressively with no symptoms continue risk factors modifications which include aspirin . Dyslipidemia taking Lipitor 80 which I will continue I did review [facility] which show me data from summer LDL 102 HDL 39.  Will recheck fasting lipid profile.   Medication Adjustments/Labs and Tests Ordered: Current medicines are reviewed at length with the patient today.  Concerns regarding medicines are outlined above.  Orders Placed This Encounter  Procedures   EKG 12-Lead   Medication changes: No orders of the defined types were placed in this encounter.   Signed, Lamar DOROTHA Fitch, MD, [facility] 04/30/2024 3:03 PM    [facility]

## 2024-05-01 ENCOUNTER — Other Ambulatory Visit: Payer: Self-pay | Admitting: Cardiology

## 2024-05-01 ENCOUNTER — Ambulatory Visit (HOSPITAL_BASED_OUTPATIENT_CLINIC_OR_DEPARTMENT_OTHER): Admission: RE | Admit: 2024-05-01 | Discharge: 2024-05-01 | Attending: Cardiology | Admitting: Cardiology

## 2024-05-01 DIAGNOSIS — Z01810 Encounter for preprocedural cardiovascular examination: Secondary | ICD-10-CM | POA: Insufficient documentation

## 2024-05-01 DIAGNOSIS — R0609 Other forms of dyspnea: Secondary | ICD-10-CM | POA: Insufficient documentation

## 2024-05-01 DIAGNOSIS — I471 Supraventricular tachycardia, unspecified: Secondary | ICD-10-CM | POA: Insufficient documentation

## 2024-05-01 DIAGNOSIS — I420 Dilated cardiomyopathy: Secondary | ICD-10-CM | POA: Diagnosis not present

## 2024-05-01 DIAGNOSIS — I2510 Atherosclerotic heart disease of native coronary artery without angina pectoris: Secondary | ICD-10-CM | POA: Diagnosis not present

## 2024-05-01 DIAGNOSIS — I5020 Unspecified systolic (congestive) heart failure: Secondary | ICD-10-CM | POA: Insufficient documentation

## 2024-05-01 LAB — ECHOCARDIOGRAM COMPLETE
AR max vel: 2.51 cm2
AV Area VTI: 2.34 cm2
AV Area mean vel: 2.42 cm2
AV Mean grad: 2 mmHg
AV Peak grad: 4.1 mmHg
Ao pk vel: 1.01 m/s
Area-P 1/2: 5.02 cm2
Calc EF: 47.5 %
MV M vel: 4.83 m/s
MV Peak grad: 93.3 mmHg
MV VTI: 0.31 cm2
Radius: 0.5 cm
S' Lateral: 3.6 cm
Single Plane A2C EF: 46.4 %
Single Plane A4C EF: 47 %

## 2024-05-07 ENCOUNTER — Ambulatory Visit: Payer: Self-pay | Admitting: Cardiology

## 2024-05-08 ENCOUNTER — Other Ambulatory Visit: Payer: Self-pay | Admitting: Orthopedic Surgery

## 2024-05-08 DIAGNOSIS — R2242 Localized swelling, mass and lump, left lower limb: Secondary | ICD-10-CM | POA: Diagnosis not present

## 2024-05-08 DIAGNOSIS — M67472 Ganglion, left ankle and foot: Secondary | ICD-10-CM | POA: Diagnosis not present

## 2024-05-09 ENCOUNTER — Telehealth: Payer: Self-pay

## 2024-05-09 NOTE — Telephone Encounter (Signed)
 Left message on My Chart with Echo results per Dr. Karry note. Routed to PCP.

## 2024-05-10 LAB — SURGICAL PATHOLOGY

## 2024-05-29 ENCOUNTER — Telehealth: Payer: Self-pay | Admitting: Cardiology

## 2024-05-29 MED ORDER — ATORVASTATIN CALCIUM 80 MG PO TABS: 80.0000 mg | ORAL_TABLET | Freq: Every day | ORAL | 3 refills | Status: AC

## 2024-05-29 NOTE — Telephone Encounter (Signed)
 Pt's medication was sent to pt's pharmacy as requested. Confirmation received.

## 2024-05-29 NOTE — Telephone Encounter (Signed)
" °*  STAT* If patient is at the pharmacy, call can be transferred to refill team.   1. Which medications need to be refilled? (please list name of each medication and dose if known)   atorvastatin  (LIPITOR) 80 MG tablet     2. Would you like to learn more about the convenience, safety, & potential cost savings by using the [facility]? No    3. Are you open to using the [facility] (Type [facility]. No    4. Which pharmacy/location (including street and city if local pharmacy) is medication to be sent to?[id number] [place], [place] - [address]    5. Do they need a 30 day or 90 day supply? 90 day   Pt is out of medication  "

## 2024-06-05 ENCOUNTER — Ambulatory Visit: Admitting: Cardiology

## 2024-06-24 ENCOUNTER — Other Ambulatory Visit: Payer: Self-pay | Admitting: Cardiology
